# Patient Record
Sex: Female | Born: 1980
Health system: Southern US, Community
[De-identification: ages and names within clinical notes are randomized; demographics above are authoritative.]

## PROBLEM LIST (undated history)

## (undated) DIAGNOSIS — M797 Fibromyalgia: Secondary | ICD-10-CM

## (undated) DIAGNOSIS — Z8742 Personal history of other diseases of the female genital tract: Secondary | ICD-10-CM

## (undated) DIAGNOSIS — N289 Disorder of kidney and ureter, unspecified: Secondary | ICD-10-CM

## (undated) DIAGNOSIS — N201 Calculus of ureter: Secondary | ICD-10-CM

## (undated) DIAGNOSIS — E039 Hypothyroidism, unspecified: Secondary | ICD-10-CM

## (undated) DIAGNOSIS — M26609 Unspecified temporomandibular joint disorder, unspecified side: Secondary | ICD-10-CM

## (undated) DIAGNOSIS — D649 Anemia, unspecified: Secondary | ICD-10-CM

## (undated) HISTORY — PX: APPENDECTOMY: SHX54

## (undated) HISTORY — PX: LITHOTRIPSY: SUR834

## (undated) HISTORY — DX: Personal history of other diseases of the female genital tract: Z87.42

## (undated) HISTORY — PX: CYSTECTOMY: SUR359

## (undated) HISTORY — DX: Fibromyalgia: M79.7

---

## 1999-05-07 ENCOUNTER — Encounter: Payer: Self-pay | Admitting: Unknown Physician Specialty

## 1999-05-07 ENCOUNTER — Ambulatory Visit (HOSPITAL_COMMUNITY): Admission: RE | Admit: 1999-05-07 | Discharge: 1999-05-07 | Payer: Self-pay | Admitting: Unknown Physician Specialty

## 1999-05-12 ENCOUNTER — Other Ambulatory Visit: Admission: RE | Admit: 1999-05-12 | Discharge: 1999-05-12 | Payer: Self-pay | Admitting: Gynecology

## 1999-05-15 ENCOUNTER — Encounter: Payer: Self-pay | Admitting: Gynecology

## 1999-05-15 ENCOUNTER — Ambulatory Visit (HOSPITAL_COMMUNITY): Admission: RE | Admit: 1999-05-15 | Discharge: 1999-05-15 | Payer: Self-pay | Admitting: Gynecology

## 2000-01-21 ENCOUNTER — Inpatient Hospital Stay (HOSPITAL_COMMUNITY): Admission: AD | Admit: 2000-01-21 | Discharge: 2000-01-24 | Payer: Self-pay | Admitting: Gynecology

## 2000-02-10 DIAGNOSIS — Z8742 Personal history of other diseases of the female genital tract: Secondary | ICD-10-CM

## 2000-02-10 HISTORY — DX: Personal history of other diseases of the female genital tract: Z87.42

## 2000-03-07 ENCOUNTER — Other Ambulatory Visit: Admission: RE | Admit: 2000-03-07 | Discharge: 2000-03-07 | Payer: Self-pay | Admitting: Gynecology

## 2000-04-01 ENCOUNTER — Inpatient Hospital Stay (HOSPITAL_COMMUNITY): Admission: RE | Admit: 2000-04-01 | Discharge: 2000-04-03 | Payer: Self-pay | Admitting: Gynecology

## 2000-04-01 ENCOUNTER — Encounter (INDEPENDENT_AMBULATORY_CARE_PROVIDER_SITE_OTHER): Payer: Self-pay | Admitting: Specialist

## 2001-03-16 ENCOUNTER — Other Ambulatory Visit: Admission: RE | Admit: 2001-03-16 | Discharge: 2001-03-16 | Payer: Self-pay | Admitting: Gynecology

## 2002-04-18 ENCOUNTER — Other Ambulatory Visit: Admission: RE | Admit: 2002-04-18 | Discharge: 2002-04-18 | Payer: Self-pay | Admitting: Gynecology

## 2002-11-12 ENCOUNTER — Inpatient Hospital Stay (HOSPITAL_COMMUNITY): Admission: AD | Admit: 2002-11-12 | Discharge: 2002-11-14 | Payer: Self-pay | Admitting: Gynecology

## 2002-12-27 ENCOUNTER — Other Ambulatory Visit: Admission: RE | Admit: 2002-12-27 | Discharge: 2002-12-27 | Payer: Self-pay | Admitting: Gynecology

## 2004-01-16 ENCOUNTER — Other Ambulatory Visit: Admission: RE | Admit: 2004-01-16 | Discharge: 2004-01-16 | Payer: Self-pay | Admitting: Gynecology

## 2004-05-17 ENCOUNTER — Emergency Department (HOSPITAL_COMMUNITY): Admission: EM | Admit: 2004-05-17 | Discharge: 2004-05-18 | Payer: Self-pay | Admitting: Emergency Medicine

## 2005-02-04 ENCOUNTER — Other Ambulatory Visit: Admission: RE | Admit: 2005-02-04 | Discharge: 2005-02-04 | Payer: Self-pay | Admitting: Gynecology

## 2006-02-07 ENCOUNTER — Other Ambulatory Visit: Admission: RE | Admit: 2006-02-07 | Discharge: 2006-02-07 | Payer: Self-pay | Admitting: Gynecology

## 2006-03-25 ENCOUNTER — Ambulatory Visit (HOSPITAL_COMMUNITY): Admission: RE | Admit: 2006-03-25 | Discharge: 2006-03-25 | Payer: Self-pay | Admitting: Emergency Medicine

## 2007-02-17 ENCOUNTER — Other Ambulatory Visit: Admission: RE | Admit: 2007-02-17 | Discharge: 2007-02-17 | Payer: Self-pay | Admitting: Gynecology

## 2008-07-12 HISTORY — PX: TUBAL LIGATION: SHX77

## 2008-08-01 ENCOUNTER — Inpatient Hospital Stay (HOSPITAL_COMMUNITY): Admission: AD | Admit: 2008-08-01 | Discharge: 2008-08-03 | Payer: Self-pay | Admitting: Obstetrics and Gynecology

## 2008-08-11 ENCOUNTER — Emergency Department (HOSPITAL_COMMUNITY): Admission: EM | Admit: 2008-08-11 | Discharge: 2008-08-11 | Payer: Self-pay | Admitting: Family Medicine

## 2008-11-05 ENCOUNTER — Ambulatory Visit (HOSPITAL_COMMUNITY): Admission: RE | Admit: 2008-11-05 | Discharge: 2008-11-05 | Payer: Self-pay | Admitting: Obstetrics and Gynecology

## 2008-11-20 ENCOUNTER — Emergency Department (HOSPITAL_COMMUNITY): Admission: EM | Admit: 2008-11-20 | Discharge: 2008-11-20 | Payer: Self-pay | Admitting: Family Medicine

## 2010-05-27 ENCOUNTER — Emergency Department (HOSPITAL_COMMUNITY): Admission: EM | Admit: 2010-05-27 | Discharge: 2010-05-27 | Payer: Self-pay | Admitting: Family Medicine

## 2010-05-28 ENCOUNTER — Emergency Department (HOSPITAL_COMMUNITY): Admission: EM | Admit: 2010-05-28 | Discharge: 2010-05-28 | Payer: Self-pay | Admitting: Emergency Medicine

## 2010-05-29 ENCOUNTER — Emergency Department (HOSPITAL_COMMUNITY): Admission: EM | Admit: 2010-05-29 | Discharge: 2010-05-29 | Payer: Self-pay | Admitting: Emergency Medicine

## 2010-07-30 ENCOUNTER — Other Ambulatory Visit: Payer: Self-pay | Admitting: Women's Health

## 2010-07-30 ENCOUNTER — Ambulatory Visit
Admission: RE | Admit: 2010-07-30 | Discharge: 2010-07-30 | Payer: Self-pay | Source: Home / Self Care | Attending: Women's Health | Admitting: Women's Health

## 2010-07-30 ENCOUNTER — Other Ambulatory Visit
Admission: RE | Admit: 2010-07-30 | Discharge: 2010-07-30 | Payer: Self-pay | Source: Home / Self Care | Admitting: Gynecology

## 2010-08-03 ENCOUNTER — Encounter
Admission: RE | Admit: 2010-08-03 | Discharge: 2010-08-03 | Payer: Self-pay | Source: Home / Self Care | Attending: Obstetrics and Gynecology | Admitting: Obstetrics and Gynecology

## 2010-10-20 LAB — POCT URINALYSIS DIP (DEVICE)
Glucose, UA: NEGATIVE mg/dL
Nitrite: NEGATIVE
Urobilinogen, UA: 0.2 mg/dL (ref 0.0–1.0)

## 2010-10-20 LAB — POCT PREGNANCY, URINE: Preg Test, Ur: NEGATIVE

## 2010-10-21 LAB — URINALYSIS, ROUTINE W REFLEX MICROSCOPIC
Glucose, UA: NEGATIVE mg/dL
Nitrite: NEGATIVE
Protein, ur: NEGATIVE mg/dL
Urobilinogen, UA: 0.2 mg/dL (ref 0.0–1.0)

## 2010-10-21 LAB — CBC
MCHC: 34.6 g/dL (ref 30.0–36.0)
RBC: 4.53 MIL/uL (ref 3.87–5.11)

## 2010-10-21 LAB — PREGNANCY, URINE: Preg Test, Ur: NEGATIVE

## 2010-10-26 LAB — CBC
Hemoglobin: 10.6 g/dL — ABNORMAL LOW (ref 12.0–15.0)
Platelets: 216 10*3/uL (ref 150–400)
Platelets: 250 10*3/uL (ref 150–400)
RDW: 17.7 % — ABNORMAL HIGH (ref 11.5–15.5)
WBC: 15.1 10*3/uL — ABNORMAL HIGH (ref 4.0–10.5)

## 2010-10-26 LAB — RPR: RPR Ser Ql: NONREACTIVE

## 2010-10-26 LAB — CCBB MATERNAL DONOR DRAW

## 2010-11-24 NOTE — Op Note (Signed)
Jenna Gray, Jenna Gray              ACCOUNT NO.:  [id number]   MEDICAL RECORD NO.:  [id number]          PATIENT TYPE:  AMB   LOCATION:  SDC                           FACILITY:  [facility]   PHYSICIAN:  Gerald Leitz, MD          DATE OF BIRTH:  August 05, 1980   DATE OF PROCEDURE:  11/05/2008  DATE OF DISCHARGE:                               OPERATIVE REPORT   PREOPERATIVE DIAGNOSIS:  Desires permanent sterilization.   POSTOPERATIVE DIAGNOSIS:  Desires permanent sterilization.   PROCEDURE:  Laparoscopic bilateral tubal ligation with Filshie clip.   SURGEON:  Gerald Leitz, MD   ASSISTANT:  None.   ANESTHESIA:  General.   FINDINGS:  Small adhesions of small intestines to anterior abdominal  wall.   SPECIMEN:  None.   ESTIMATED BLOOD LOSS:  Minimal.   URINE OUTPUT:  25 mL at the beginning of the procedure.   FLUIDS:  Per Anesthesia.   COMPLICATIONS:  None.   INDICATIONS:  A 30 year old G4, P4 who desires permanent sterilization.  Informed consent was obtained.   PROCEDURE:  The patient was taken to the operating room where she was  placed under general anesthesia.  She was placed in dorsal lithotomy  position, prepped and draped in the usual sterile fashion.  An in-and-  out catheterization was performed prior to draping the patient and a  speculum was placed into the vaginal vault and the uterine manipulator  was placed without difficulty.  The patient was then draped.  0.25%  Marcaine was injected at the umbilicus and a 10-mm skin incision was  made at the umbilicus with the scalpel.  The 10-mm Excel trocar was  placed under direct visualization.  Pneumoperitoneum was then achieved  with CO2 gas.  The patient was then noted to have some adhesions along  the left aspect of the abdomen of the intestines to the anterior  abdominal wall.  The laparoscope was easily maneuvered beyond this point  without difficulty.  Fallopian tubes were identified bilaterally and  followed up to the  ipsilateral fimbria.  The ovaries appeared normal  bilaterally.  A 10-mm operative laparoscope was inserted through the 10-  mm trocar and Filshie clips were placed along the right and left  fallopian tubes without difficulty.  Operative scope was removed.  Diagnostic scope was then replaced and the pneumoperitoneum was  released.  The 10-mm trocar was removed under direct visualization.  The  fascia was closed with 0 Vicryl.  Skin was closed with 4-0 Vicryl.  Attention was turned to the vagina where a speculum placed and the  uterine manipulator  was removed.  The patient was found to have some bleeding at the  tenaculum site.  Silver nitrate was applied.  Hemostasis was obtained.  The patient was awakened from anesthesia, taken to the recovery room  awake and in stable condition.      Gerald Leitz, MD  Electronically Signed     TC/MEDQ  D:  11/05/2008  T:  11/06/2008  Job:  [id number]

## 2010-11-27 NOTE — Discharge Summary (Signed)
[facility]  Patient:    Jenna Gray, Jenna Gray                     MRN: [id number] Adm. Date:  [id number] Disc. Date: [id number] Attending:  Douglass Rivers Dictator:   Antony Contras, [facility]                           Discharge Summary  DISCHARGE DIAGNOSES:          1. Intrauterine pregnancy at 39 weeks.                               2. History of left ovarian mass per ultrasound.  PROCEDURES:                   Normal spontaneous vaginal delivery viable infant with second degree midline laceration repair.  HISTORY OF PRESENT ILLNESS:   The patient is a 30 year old gravida 2, para 1-0-0-1, LMP April 21, 1999, [facility], January 19, 2000.  Prenatal risk factors include a pelvic mass which was noted on an ultrasound on May 07, 1999, at [place]. [facility] which demonstrated a left ovarian mass measuring 4.9 x 4.5 x 4.4 cm with some solid nodules.  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  PRENATAL LABS:                Blood type O positive, antibody screen negative. RPR, HBSAG, and HIV nonreactive.  MSAST within normal limits. GBS is negative.   HOSPITAL COURSE/TREATMENT:    The patient was admitted on January 22, 2000, with two to five minute contractions.  Cervix was 3 cm, 80% effaced.  Labor did progress to complete dilatation.  She was delivered of an Apgar 8 and 21 female infant weighing 8 pounds 12 ounces over an intact perineum, sustaining a second degree midline laceration.  Her postpartum course was within normal limits.  She remained afebrile and no difficulty with voiding.  She was able to be discharged on her second postpartum day in satisfactory condition.  DISPOSITION:                  Follow-up in six weeks.  Continue prenatal vitamins and iron.  The patient was also counseled that she will need to arrange follow-up on the ovarian mass which will be  done at her postpartum appointment.  Postpartum CBC revealed hematocrit 29.5, hemoglobin 9.8, wbc 12.8, platelets 247. DD:  02/22/00 TD:  02/23/00 Job: [id number]

## 2010-11-27 NOTE — Discharge Summary (Signed)
[facility]  Patient:    Jenna Gray, Jenna Gray                     MRN: [id number] Adm. Date:  [id number] Disc. Date: [id number] Attending:  Tonye Royalty Dictator:   Antony Contras, [facility]                           Discharge Summary  DISCHARGE DIAGNOSIS:          Dermoid cyst.  PROCEDURE:                    Left ovarian cystectomy.  HISTORY OF PRESENT ILLNESS:   The patient is a 30 year old, gravida 1, para 1, who had a normal spontaneous vaginal delivery on January 22, 2000, and had returned for an ultrasound because of the possibility that she had a persistent dermoid cyst that had been diagnosed right before conception, and her surgery had to be canceled because she was pregnant.  She had initially been seen before her pregnancy in the Urgent Care Clinic for a back injury, and incidentally the ovarian cyst was noted at the time of a CT scan of her back.  During her pregnancy the cyst was monitored with essentially no change and appeared to be highly suspicious for a dermoid.  She otherwise has no risk factors.  She was admitted for an exploratory laparotomy with a left ovarian cystectomy and possible left salpingo-oophorectomy and possible staging procedures.  HOSPITAL COURSE:              The patient was admitted for surgery.  A left ovarian cystectomy was performed by Dr. Lily Peer assisted by Dr. Penni Homans under general anesthesia.  Findings:  There was a 3 x 4 cm left ovarian cyst. Postoperatively, the patient remained afebrile, had no difficulty voiding, and was able to be discharged on her second postoperative day in satisfactory condition.  LABORATORY DATA:              CBC:  Hematocrit 33.4, hemoglobin 11.3, platelets 222.  DISCHARGE FOLLOWUP:           To follow up in the office in one week to remove staples.  DISCHARGE MEDICATIONS:        Percocet for pain. DD:  05/30/00 TD:  05/30/00 Job: [id number]

## 2010-11-27 NOTE — Discharge Summary (Signed)
   NAME:  Jenna Gray, Jenna Gray Rhonda L                        ACCOUNT NO.:  [id number]   MEDICAL RECORD NO.:  [id number]                   PATIENT TYPE:  INP   LOCATION:  9126                                 [facility]ACILITY:  [facility]   PHYSICIAN:  Timothy P. Fontaine, M.D.           DATE OF BIRTH:  19-May-1981   DATE OF ADMISSION:  11/12/2002  DATE OF DISCHARGE:  11/14/2002                                 DISCHARGE SUMMARY   DISCHARGE DIAGNOSIS:  Intrauterine pregnancy, 39 weeks, delivered, status  post spontaneous vaginal delivery.   HISTORY:  This is a 30 year old female, gravida 3, para 2, with an EDC of  11/15/2002.  Prenatal course was uncomplicated.   HOSPITAL COURSE:  On 11/12/2002, the patient was admitted at 39+ weeks  complaining of bloody discharge.  Vaginal exam was 4 cm, where it had been 1  to 2 the previous p.m.  Therefore secondary to advanced dilatation, the  patient was admitted, artificial rupture of membranes was performed, and  subsequently on 11/12/2002, the patient underwent a spontaneous vaginal  delivery of a female, Apgars of 9 and 9, weight 8 pounds and 11 ounces.  There  was noted to be a tight nuchal cord which was cut and clamped prior to  delivery.  There was a first degree which was repaired without  complications.  Postpartum, the patient remained afebrile, voiding, in  stable condition.  She was discharged home on 11/14/2002 and given  [facility] postpartum booklet.   CLINICAL FINDINGS AND LABS:  The patient is O positive.  Rubella immune.  Hemoglobin on 11/13/2002 was 11.1.   DISPOSITION:  The patient is discharged home.  Appointment to return to  office in six weeks.  __________ time to be see in the office.  Was given a  prescription for Tylox p.r.n. pain.      Susa Loffler, P.A.                    Timothy P. Audie Box, M.D.    Ardath Sax  D:  12/11/2002  T:  12/11/2002  Job:  [id number]

## 2010-11-27 NOTE — Op Note (Signed)
[facility]  Patient:    Jenna Gray, Jenna Gray Visit Number: [id number] MRN: [id number]          Service Type: GYN Location: [id number] Attending Physician:  Tonye Royalty Proc. Date: 04/01/00 Admit Date:  04/01/2000                             Operative Report  PREOPERATIVE DIAGNOSIS:       Left dermoid cyst.  POSTOPERATIVE DIAGNOSIS:      Left dermoid cyst.  OPERATION:                    1. Exploratory laparotomy.                               2. Left ovarian cystectomy.  SURGEON:                      Juan H. Lily Peer, M.D.  ASSISTANT:                    Katy Fitch, M.D.  ANESTHESIA:                   General endotracheal.  INDICATIONS FOR PROCEDURE:    A 30 year old gravida 1, para 1 with persistent left adnexal mass.  Characteristic features on ultrasound suspicious for a dermoid cyst.  FINDINGS:                     The patient had a 3 x 4 cm left ovarian cyst consistent with the appearance of a dermoid cyst.  There was no external excrescences on the left ovary.  Both fallopian tubes looked nice and healthy with lush fimbriated end.  The contralateral ovary was normal size, shape, and consistency.  Cul-de-sac was free of adhesions and the peritoneal surfaces without evidence of endometriosis.  DESCRIPTION OF PROCEDURE:     After the patient was adequately counseled, she was taken to the operating room where she underwent a successful general endotracheal anesthesia.  She was placed in the supine position after the abdomen, vagina, and perineum were prepped and draped in the usual sterile fashion.  Prior to this, a Foley catheter had been inserted.  A small Pfannenstiel incision was made 2 cm above the symphysis pubis.  The incision was carried down through the skin and subcutaneous tissue, down to the rectus fascia, and the rectus fascia was incised in a transverse fashion.  The midline raphe was entered and the  peritoneal cavity was entered cautious. Pelvic washings were obtained.  The left adnexal mass is mobile and was brought out through the incision site for minimal contact with the peritoneal surfaces to prevent adhesion formation later.  A wet lap was placed around the ovary and the ovary was placed under tension with a Babcock clamp at the utero-ovarian ligament junction.                                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 The cortex of the ovary was irrigated and remaining ovarian tissue was reapproximated in a mattress stitch-type fashion  with several layers of 3-0 Vicryl suture, imbricating and hiding the suture.                                After this, the pelvic cavity was copiously irrigated with normal saline solution.  The visceroperitoneum was now reapproximated.  Sponge count and needle count were correct and the rectus fascia was closed with a running stitch of 0 Vicryl suture.  Subcutaneous bleeders were Bovie cauterized and the skin was reapproximated with skin clips followed by placement of Xeroform gauze followed by a dressing.                                The patient was extubated and transferred to the recovery room with stable vital signs.  Blood loss for the procedure was minimal.  Fluid resuscitation consisted of 1200 cc of lactated Ringers and urine output was 200 cc clear, and the patient received 1 g of Cefotan prophylactically. Attending Physician:  Tonye Royalty DD:  04/01/00 TD:  04/03/00 Job: [id number]

## 2010-12-02 ENCOUNTER — Ambulatory Visit: Payer: Self-pay | Admitting: Women's Health

## 2011-01-30 ENCOUNTER — Encounter: Payer: Self-pay | Admitting: *Deleted

## 2011-01-30 ENCOUNTER — Emergency Department (HOSPITAL_BASED_OUTPATIENT_CLINIC_OR_DEPARTMENT_OTHER)
Admission: EM | Admit: 2011-01-30 | Discharge: 2011-01-30 | Disposition: A | Discharge status: Home / Self Care | Payer: 59 | Attending: Emergency Medicine | Admitting: Emergency Medicine

## 2011-01-30 DIAGNOSIS — S239XXA Sprain of unspecified parts of thorax, initial encounter: Secondary | ICD-10-CM | POA: Insufficient documentation

## 2011-01-30 DIAGNOSIS — X500XXA Overexertion from strenuous movement or load, initial encounter: Secondary | ICD-10-CM | POA: Insufficient documentation

## 2011-01-30 MED ORDER — KETOROLAC TROMETHAMINE 60 MG/2ML IM SOLN
60.0000 mg | Freq: Once | INTRAMUSCULAR | Status: AC
  Administered 2011-01-30: 60 mg via INTRAMUSCULAR
  Filled 2011-01-30: qty 2

## 2011-01-30 MED ORDER — CYCLOBENZAPRINE HCL 10 MG PO TABS: 10.0000 mg | ORAL_TABLET | Freq: Two times a day (BID) | ORAL | Status: AC | PRN

## 2011-01-30 MED ORDER — IBUPROFEN 800 MG PO TABS: 800.0000 mg | ORAL_TABLET | Freq: Three times a day (TID) | ORAL | Status: AC

## 2011-01-30 NOTE — ED Provider Notes (Signed)
History     Chief Complaint  Patient presents with  . Back Pain   HPI Comments: Patient was bending over and twisting while carrying a heavy glass dish and felt an acute onset of right sided mid and upper back pain. This is constant, worse with rotation and palpation, not associated with weakness or numbness of the upper extremities. This occurred several hours prior to arrival. She has tried both hot and cold packs without relief but no medications prior to arrival.  Patient is a 30 y.o. female presenting with back pain. The history is provided by the patient and a relative.  Back Pain  Pertinent negatives include no fever, no numbness and no weakness.    History reviewed. No pertinent past medical history.  Past Surgical History  Procedure Date  . Cystectomy   . Appendectomy   . Tubal ligation     History reviewed. No pertinent family history.  History  Substance Use Topics  . Smoking status: Never Smoker   . Smokeless tobacco: Not on file  . Alcohol Use: No    OB History    Grav Para Term Preterm Abortions TAB SAB Ect Mult Living                  Review of Systems  Constitutional: Negative for fever and chills.  HENT: Negative for neck pain.   Gastrointestinal: Negative for nausea, vomiting and diarrhea.  Genitourinary: Negative for difficulty urinating.  Musculoskeletal: Positive for back pain.  Skin: Negative for rash.  Neurological: Negative for weakness and numbness.    Physical Exam  BP 129/78  Pulse 84  Temp(Src) 98.9 F (37.2 C) (Oral)  Resp 18  Ht 5\' 1"  (1.549 m)  Wt 150 lb (68.04 kg)  BMI 28.34 kg/m2  SpO2 100%  LMP 01/23/2011  Physical Exam  Nursing note and vitals reviewed. Constitutional: She appears well-developed and well-nourished. No distress.  HENT:  Head: Normocephalic and atraumatic.  Eyes: Conjunctivae are normal. No scleral icterus.  Neck: Normal range of motion. Neck supple.  Cardiovascular: Normal rate, regular rhythm and  normal heart sounds.   Pulmonary/Chest: Effort normal and breath sounds normal.  Musculoskeletal: Normal range of motion. She exhibits tenderness ( Tenderness to palpation of the right rhomboid area and right paraspinal muscles in the thoracic spine. There is no cervical, thoracic, lumbar, sacral tenderness to palpation.). She exhibits no edema.  Lymphadenopathy:    She has no cervical adenopathy.  Neurological: She is alert. Coordination normal.       Motor and sensation of the upper and lower extremities normal.  Skin: Skin is warm. No rash noted.    ED Course  Procedures  MDM Patient with likely strain of the muscles of her back. IM Toradol given in the emergency department. Vital signs normal, neurologic exam normal. Will discharge with ibuprofen and Flexeril.      Vida Roller, MD 01/30/11 2059

## 2011-01-30 NOTE — ED Notes (Signed)
Pt states she was lifting a dish and felt a sharp pain in her lower back.

## 2011-04-26 ENCOUNTER — Encounter: Payer: Self-pay | Admitting: Women's Health

## 2011-04-26 ENCOUNTER — Ambulatory Visit (INDEPENDENT_AMBULATORY_CARE_PROVIDER_SITE_OTHER): Payer: 59 | Admitting: Women's Health

## 2011-04-26 ENCOUNTER — Ambulatory Visit (INDEPENDENT_AMBULATORY_CARE_PROVIDER_SITE_OTHER): Payer: 59

## 2011-04-26 VITALS — BP 120/70

## 2011-04-26 DIAGNOSIS — R1032 Left lower quadrant pain: Secondary | ICD-10-CM

## 2011-04-26 DIAGNOSIS — N831 Corpus luteum cyst of ovary, unspecified side: Secondary | ICD-10-CM

## 2011-04-26 NOTE — Progress Notes (Signed)
  Presents with a complaint of lower left quadrant pain for greater than one week. She states she is unable to sleep well due to the discomfort. She does appear well today. Denies any nausea, vomiting, constipation or diarrhea or changes in elimination. Denies any UTI symptoms, denies discharge. Was seen at urgent  care yesterday and was prescribed Mobic and Flagyl for BV. Continues with the pain. History of a left dermoid cyst in 2001, BTL in 10, and appendectomy at age 71. Having a monthly regular cycle.  Exam: No CVAT, abdomen is soft no rebound or radiation but tender in the lower left quadrant. Speculum exam no discharge or odor noted. Bimanual no CMT no tenderness on the right side but was tender on the left, no adnexal fullness.  Plan: Ultrasound, shows an anteverted uterus, right ovary has a thick walled follicle 14 x 13 mm, left ovary was normal negative cul-de-sac she is due for a cycle next week. Will continue with the Mobic and Motrin as needed for pain, increase rest call or return if no relief of pain.

## 2011-07-08 ENCOUNTER — Ambulatory Visit (INDEPENDENT_AMBULATORY_CARE_PROVIDER_SITE_OTHER): Payer: 59

## 2011-07-08 ENCOUNTER — Emergency Department (INDEPENDENT_AMBULATORY_CARE_PROVIDER_SITE_OTHER)
Admission: EM | Admit: 2011-07-08 | Discharge: 2011-07-08 | Disposition: A | Discharge status: Home / Self Care | Payer: 59 | Source: Home / Self Care

## 2011-07-08 ENCOUNTER — Encounter (HOSPITAL_COMMUNITY): Payer: Self-pay | Admitting: Emergency Medicine

## 2011-07-08 DIAGNOSIS — K0889 Other specified disorders of teeth and supporting structures: Secondary | ICD-10-CM

## 2011-07-08 DIAGNOSIS — N920 Excessive and frequent menstruation with regular cycle: Secondary | ICD-10-CM

## 2011-07-08 DIAGNOSIS — R509 Fever, unspecified: Secondary | ICD-10-CM

## 2011-07-08 DIAGNOSIS — K089 Disorder of teeth and supporting structures, unspecified: Secondary | ICD-10-CM

## 2011-07-08 MED ORDER — CLINDAMYCIN HCL 300 MG PO CAPS: 300.0000 mg | ORAL_CAPSULE | Freq: Three times a day (TID) | ORAL | Status: AC

## 2011-07-08 MED ORDER — KETOROLAC TROMETHAMINE 60 MG/2ML IM SOLN
INTRAMUSCULAR | Status: AC
  Filled 2011-07-08: qty 2

## 2011-07-08 MED ORDER — KETOROLAC TROMETHAMINE 60 MG/2ML IM SOLN
60.0000 mg | Freq: Once | INTRAMUSCULAR | Status: AC
  Administered 2011-07-08: 60 mg via INTRAMUSCULAR

## 2011-07-08 NOTE — ED Provider Notes (Signed)
History     CSN: [id number]  Arrival date & time 07/08/11  1202   None     Chief Complaint  Patient presents with  . Dental Pain    (Consider location/radiation/quality/duration/timing/severity/associated sxs/prior treatment) HPI Comments: Onset of Rt lower tooth pain 9 pm last night. Pt states she had a root canal when she was younger, on her mothers dental insurance and had a temporary crown. She didn't return for the permanent crown. She no longer has dental insurance Her dentist referred her 6-7 mos ago to an Transport planner but she was unable to afford the OV fee so didn't go. No fever, chills or swelling noted. She has been taking Tylenol and Ibuprofen without relief of her discomfort. Pain is worse with eating, drinking and cold air exposure.  The history is provided by the patient.    Past Medical History  Diagnosis Date  . History of ovarian cyst 02/2000    DERMOID, LEFT  . Fibromyalgia     Past Surgical History  Procedure Date  . Cystectomy     OVARIAN DERMOID  . Tubal ligation   . Appendectomy     AGE 30    Family History  Problem Relation Age of Onset  . Hypertension Father   . Breast cancer Maternal Aunt   . Cancer Paternal Grandmother     COLON , PANCREATIC    History  Substance Use Topics  . Smoking status: Never Smoker   . Smokeless tobacco: Never Used  . Alcohol Use: No    OB History    Grav Para Term Preterm Abortions TAB SAB Ect Mult Living   4 4        4       Review of Systems  Constitutional: Negative for fever and chills.  HENT: Negative for ear pain, congestion, sore throat, rhinorrhea and sinus pressure.   Respiratory: Negative for shortness of breath.   Cardiovascular: Negative for chest pain.    Allergies  Penicillins  Home Medications   Current Outpatient Rx  Name Route Sig Dispense Refill  . ACETAMINOPHEN 325 MG PO TABS Oral Take 650 mg by mouth every 6 (six) hours as needed.      Marland Kitchen BENZOCAINE 10 % MT GEL Mouth/Throat  Use as directed in the mouth or throat as needed.      . IBUPROFEN 200 MG PO TABS Oral Take 200 mg by mouth every 6 (six) hours as needed.      Marland Kitchen CLINDAMYCIN HCL 300 MG PO CAPS Oral Take 1 capsule (300 mg total) by mouth 3 (three) times daily. 21 capsule 0    BP 135/86  Pulse 74  Temp(Src) 98.6 F (37 C) (Oral)  Resp 16  SpO2 100%  LMP 07/01/2011  Physical Exam  Nursing note and vitals reviewed. Constitutional: She appears well-developed and well-nourished. No distress.  HENT:  Head: Normocephalic and atraumatic.  Right Ear: Tympanic membrane, external ear and ear canal normal.  Left Ear: Tympanic membrane, external ear and ear canal normal.  Nose: Nose normal.  Mouth/Throat: Uvula is midline, oropharynx is clear and moist and mucous membranes are normal. No oropharyngeal exudate, posterior oropharyngeal edema or posterior oropharyngeal erythema.    Neck: Neck supple.  Cardiovascular: Normal rate, regular rhythm and normal heart sounds.   Pulmonary/Chest: Effort normal and breath sounds normal. No respiratory distress.  Lymphadenopathy:    She has no cervical adenopathy.  Neurological: She is alert.  Skin: Skin is warm and dry.  Psychiatric: She  has a normal mood and affect.    ED Course  Procedures (including critical care time)  Labs Reviewed - No data to display No results found.   1. Pain, dental       MDM  Previous records reviewed. Hx of Tramadol abuse.         Melody Comas, [place] 07/08/11 1527

## 2011-07-08 NOTE — ED Notes (Signed)
C/o tooth pain, bottom, right.  Previously went to dentist and sent to oral surgeon, expense too much.  Patient reports tooth is broken.

## 2011-07-09 NOTE — ED Provider Notes (Signed)
Medical screening examination/treatment/procedure(s) were performed by non-physician practitioner and as supervising physician I was immediately available for consultation/collaboration.   [facility]; MD   Sharin Grave, MD 07/09/11 [phone]

## 2011-08-08 ENCOUNTER — Encounter (HOSPITAL_COMMUNITY): Payer: Self-pay

## 2011-08-08 ENCOUNTER — Emergency Department (HOSPITAL_COMMUNITY)
Admission: EM | Admit: 2011-08-08 | Discharge: 2011-08-08 | Disposition: A | Discharge status: Home / Self Care | Payer: 59 | Source: Home / Self Care | Attending: Emergency Medicine | Admitting: Emergency Medicine

## 2011-08-08 DIAGNOSIS — S0340XA Sprain of jaw, unspecified side, initial encounter: Secondary | ICD-10-CM

## 2011-08-08 HISTORY — DX: Unspecified temporomandibular joint disorder, unspecified side: M26.609

## 2011-08-08 MED ORDER — HYDROCODONE-ACETAMINOPHEN 5-325 MG PO TABS: 2.0000 | ORAL_TABLET | Freq: Once | ORAL | Status: DC

## 2011-08-08 MED ORDER — HYDROCODONE-ACETAMINOPHEN 5-325 MG PO TABS: 2.0000 | ORAL_TABLET | ORAL | Status: AC | PRN

## 2011-08-08 MED ORDER — MELOXICAM 15 MG PO TABS: 15.0000 mg | ORAL_TABLET | Freq: Every day | ORAL | Status: DC

## 2011-08-08 NOTE — ED Notes (Signed)
Signature pad not working. 

## 2011-08-08 NOTE — ED Provider Notes (Signed)
History     CSN: [id number]  Arrival date & time 08/08/11  1017   First MD Initiated Contact with Patient 08/08/11 1037      Chief Complaint  Patient presents with  . Headache    (Consider location/radiation/quality/duration/timing/severity/associated sxs/prior treatment) HPI Comments: Pt c/o constant throbbing bilateral temporal/frontal headache starting 2 days ago after yawning. States she heard a "pop" and a "hissing noise" in her TMJ, and has had continuous headache since. Pain worse with opening jaw. States she is unable to open her jaw fully secondary to pain. No N/V, ear pain, photophobia, phonophobia, nasal congestion, sinus pain/pressure, ear pain,  purulent nasal d/c, dental pain, neck stiffness, rash, dysarthria, focal weakness, facial droop. No sudden onset, did not occur during exertion.  Has been taking ibuprofen 800 mg 3 times a day, which helps, however patient states that it is starting to "tear up my stomach". Also taking Imitrex, Excedrin without relief.  Patient with history of migraines, although states this headache is different. Patient with history of TMJ dysfunction, and states that this pain is similar to that.  ROS as noted in HPI. All other ROS negative.     Patient is a 31 y.o. female presenting with headaches. The history is provided by the patient. No language interpreter was used.  Headache The primary symptoms include headaches. The symptoms began 3 to 5 days ago. The symptoms are unchanged.    Past Medical History  Diagnosis Date  . History of ovarian cyst 02/2000    DERMOID, LEFT  . Fibromyalgia   . Migraine   . TMJ (temporomandibular joint syndrome)     Past Surgical History  Procedure Date  . Cystectomy     OVARIAN DERMOID  . Tubal ligation   . Appendectomy     AGE 14    Family History  Problem Relation Age of Onset  . Hypertension Father   . Breast cancer Maternal Aunt   . Cancer Paternal Grandmother     COLON , PANCREATIC     History  Substance Use Topics  . Smoking status: Never Smoker   . Smokeless tobacco: Never Used  . Alcohol Use: No    OB History    Grav Para Term Preterm Abortions TAB SAB Ect Mult Living   4 4        4       Review of Systems  Neurological: Positive for headaches.    Allergies  Penicillins  Home Medications   Current Outpatient Rx  Name Route Sig Dispense Refill  . METOCLOPRAMIDE HCL 10 MG PO TABS Oral Take 10 mg by mouth 4 (four) times daily.    Marland Kitchen OVER THE COUNTER MEDICATION      . SUMATRIPTAN SUCCINATE 100 MG PO TABS Oral Take 100 mg by mouth every 2 (two) hours as needed.    Marland Kitchen HYDROCODONE-ACETAMINOPHEN 5-325 MG PO TABS Oral Take 2 tablets by mouth every 4 (four) hours as needed for pain. 20 tablet 0  . MELOXICAM 15 MG PO TABS Oral Take 1 tablet (15 mg total) by mouth daily. 14 tablet 0    BP 152/95  Pulse 122  Temp(Src) 98.2 F (36.8 C) (Oral)  Resp 20  SpO2 100%  LMP 07/26/2011  Physical Exam  Nursing note and vitals reviewed. Constitutional: She is oriented to person, place, and time. She appears well-developed and well-nourished. She appears distressed.       Appears uncomfortable  HENT:  Head: Normocephalic and atraumatic.  Right Ear: Tympanic  membrane normal.  Left Ear: Tympanic membrane normal.  Nose: Nose normal. Right sinus exhibits no maxillary sinus tenderness and no frontal sinus tenderness. Left sinus exhibits no maxillary sinus tenderness and no frontal sinus tenderness.  Mouth/Throat: Uvula is midline, oropharynx is clear and moist and mucous membranes are normal. Normal dentition.       Bilateral TMJ tenderness. Palpable "click" when patient opening, closing jaw. Bilateral claudication.   Eyes: Conjunctivae and EOM are normal. Pupils are equal, round, and reactive to light.  Neck: Normal range of motion and full passive range of motion without pain. Neck supple. No Brudzinski's sign and no Kernig's sign noted.  Cardiovascular: Regular  rhythm and normal heart sounds.  Tachycardia present.   Pulmonary/Chest: Effort normal and breath sounds normal.  Abdominal: Soft. Bowel sounds are normal. She exhibits no distension.  Musculoskeletal: Normal range of motion.  Lymphadenopathy:    She has no cervical adenopathy.  Neurological: She is alert and oriented to person, place, and time. She has normal strength. No cranial nerve deficit or sensory deficit. Coordination and gait normal.       Tandem gait steady.   Skin: Skin is warm and dry.  Psychiatric: She has a normal mood and affect. Her behavior is normal. Judgment and thought content normal.    ED Course  Procedures (including critical care time)  Labs Reviewed - No data to display No results found.   1. TMJ (sprain of temporomandibular joint)       MDM  Previous records reviewed. Patient last seen urgent care for right lower tooth pain in late December 2012. Has history of tramadol abuse per chart review. However, patient has acute injury. Will send home with long-acting NSAIDs, and a short narcotic prescription. Patient has appointment with oral surgery on February 8, however, instructed patient to try and schedule appointment for earlier.  Luiz Blare, MD 08/08/11 [phone]

## 2011-08-08 NOTE — ED Notes (Signed)
Pt has hx of migraines and has had headache for 4 days, she yawned two days ago and felt "pop" in jaw and can't open mouth fully.  Pt has hx of TMJ.

## 2011-08-16 ENCOUNTER — Telehealth: Payer: Self-pay

## 2011-08-16 NOTE — Telephone Encounter (Signed)
.  [facility] FROM [facility] HAVE QUESTIONS REGARDING PT'S TRAMADOL

## 2011-08-17 NOTE — Telephone Encounter (Signed)
Got a call from [facility] pharmacy regarding pt's tramadol.  Pt got 05/18/11 #315 to last 90 days.  She also got #90 [facility] left over from old Rx with rfs.  The rest of the RF have been cancelled.  Pt should be ready for RF on 3/4.  Per pharmacist at [facility] Janeice Robinson) she has used [facility] in the past and tried to get filled early multiple times.  Pt must have OV before any RFs are given.

## 2011-08-20 ENCOUNTER — Ambulatory Visit (INDEPENDENT_AMBULATORY_CARE_PROVIDER_SITE_OTHER): Payer: 59 | Admitting: Family Medicine

## 2011-08-20 ENCOUNTER — Encounter: Payer: Self-pay | Admitting: Family Medicine

## 2011-08-20 DIAGNOSIS — G4700 Insomnia, unspecified: Secondary | ICD-10-CM

## 2011-08-20 DIAGNOSIS — Z0000 Encounter for general adult medical examination without abnormal findings: Secondary | ICD-10-CM

## 2011-08-20 LAB — CBC WITH DIFFERENTIAL/PLATELET
Eosinophils Relative: 3.1 % (ref 0.0–5.0)
HCT: 31.8 % — ABNORMAL LOW (ref 36.0–46.0)
Hemoglobin: 10.5 g/dL — ABNORMAL LOW (ref 12.0–15.0)
Lymphocytes Relative: 30.9 % (ref 12.0–46.0)
Lymphs Abs: 2.8 10*3/uL (ref 0.7–4.0)
Monocytes Relative: 7.3 % (ref 3.0–12.0)
Neutro Abs: 5.3 10*3/uL (ref 1.4–7.7)
RBC: 4.16 Mil/uL (ref 3.87–5.11)
WBC: 9.2 10*3/uL (ref 4.5–10.5)

## 2011-08-20 LAB — HEPATIC FUNCTION PANEL
ALT: 21 U/L (ref 0–35)
Albumin: 3.8 g/dL (ref 3.5–5.2)
Alkaline Phosphatase: 51 U/L (ref 39–117)
Total Protein: 7.4 g/dL (ref 6.0–8.3)

## 2011-08-20 LAB — LDL CHOLESTEROL, DIRECT: Direct LDL: 138.4 mg/dL

## 2011-08-20 LAB — BASIC METABOLIC PANEL
CO2: 25 mEq/L (ref 19–32)
Calcium: 8.9 mg/dL (ref 8.4–10.5)
Chloride: 103 mEq/L (ref 96–112)
Creatinine, Ser: 0.9 mg/dL (ref 0.4–1.2)
Glucose, Bld: 96 mg/dL (ref 70–99)

## 2011-08-20 LAB — LIPID PANEL
Cholesterol: 215 mg/dL — ABNORMAL HIGH (ref 0–200)
VLDL: 44.2 mg/dL — ABNORMAL HIGH (ref 0.0–40.0)

## 2011-08-20 MED ORDER — TRAMADOL HCL 50 MG PO TABS: 50.0000 mg | ORAL_TABLET | Freq: Four times a day (QID) | ORAL | Status: DC | PRN

## 2011-08-20 MED ORDER — CLONAZEPAM 0.5 MG PO TABS: 0.5000 mg | ORAL_TABLET | Freq: Every evening | ORAL | Status: DC | PRN

## 2011-08-20 NOTE — Progress Notes (Signed)
  Subjective:    Patient ID: Jenna Gray, female    DOB: 1981-03-14, 31 y.o.   MRN: [id number]  HPI New to establish.  Previous MD- [facility].  GYN- [facility]  Insomnia- started 3rd shift 9 months ago.  Reports increased stress, financial issues.  Having difficulty both falling asleep and staying asleep.  Reports she will wake up at 'any little noise'.  Once awake, will have trouble 'turning my brain off'.  Denies depressive sxs.  Has tried Tylenol PM previously to fall asleep.  Also took amitriptyline but this caused HAs.  Previously took Alprazolam prn for anxiety.  Health maintenance- UTD on pap, mammo.  Overdue for labs.   Review of Systems For ROS see HPI     Objective:   Physical Exam  Vitals reviewed. Constitutional: She is oriented to person, place, and time. She appears well-developed and well-nourished. No distress.  HENT:  Head: Normocephalic and atraumatic.  Eyes: Conjunctivae and EOM are normal. Pupils are equal, round, and reactive to light.  Neck: Normal range of motion. Neck supple. No thyromegaly present.  Cardiovascular: Normal rate, regular rhythm, normal heart sounds and intact distal pulses.   No murmur heard. Pulmonary/Chest: Effort normal and breath sounds normal. No respiratory distress.  Abdominal: Soft. She exhibits no distension. There is no tenderness.  Musculoskeletal: She exhibits no edema.  Lymphadenopathy:    She has no cervical adenopathy.  Neurological: She is alert and oriented to person, place, and time.  Skin: Skin is warm and dry.  Psychiatric: She has a normal mood and affect. Her behavior is normal.          Assessment & Plan:

## 2011-08-20 NOTE — Assessment & Plan Note (Signed)
New.  Likely multifactorial- 3rd shift, anxiety.  Pt has previously been on xanax.  Will switch to klonopin to see if sleeping improves.  Reviewed supportive care and red flags that should prompt return.  Pt expressed understanding and is in agreement w/ plan.

## 2011-08-20 NOTE — Patient Instructions (Signed)
Schedule your complete physical in 4-6 weeks- you can eat before this! We'll notify you of your lab results Start the Klonopin nightly as needed for sleep/anxiety Call with any questions or concerns Welcome!  We're glad to have you!

## 2011-08-23 LAB — VITAMIN D 1,25 DIHYDROXY
Vitamin D2 1, 25 (OH)2: <8 pg/mL
Vitamin D3 1, 25 (OH)2: 70 pg/mL

## 2011-08-24 ENCOUNTER — Encounter: Payer: Self-pay | Admitting: *Deleted

## 2011-09-03 ENCOUNTER — Emergency Department (HOSPITAL_COMMUNITY)
Admission: EM | Admit: 2011-09-03 | Discharge: 2011-09-03 | Disposition: A | Discharge status: Home / Self Care | Payer: 59 | Attending: Emergency Medicine | Admitting: Emergency Medicine

## 2011-09-03 ENCOUNTER — Emergency Department (HOSPITAL_COMMUNITY): Payer: 59

## 2011-09-03 ENCOUNTER — Encounter (HOSPITAL_COMMUNITY): Payer: Self-pay | Admitting: *Deleted

## 2011-09-03 DIAGNOSIS — Z9851 Tubal ligation status: Secondary | ICD-10-CM | POA: Insufficient documentation

## 2011-09-03 DIAGNOSIS — G43909 Migraine, unspecified, not intractable, without status migrainosus: Secondary | ICD-10-CM | POA: Insufficient documentation

## 2011-09-03 DIAGNOSIS — Z9889 Other specified postprocedural states: Secondary | ICD-10-CM | POA: Insufficient documentation

## 2011-09-03 DIAGNOSIS — IMO0001 Reserved for inherently not codable concepts without codable children: Secondary | ICD-10-CM | POA: Insufficient documentation

## 2011-09-03 DIAGNOSIS — N200 Calculus of kidney: Secondary | ICD-10-CM | POA: Insufficient documentation

## 2011-09-03 LAB — URINALYSIS, ROUTINE W REFLEX MICROSCOPIC
Bilirubin Urine: NEGATIVE
Nitrite: NEGATIVE
Protein, ur: NEGATIVE mg/dL
Specific Gravity, Urine: 1.014 (ref 1.005–1.030)
Urobilinogen, UA: 0.2 mg/dL (ref 0.0–1.0)

## 2011-09-03 LAB — URINE MICROSCOPIC-ADD ON

## 2011-09-03 MED ORDER — KETOROLAC TROMETHAMINE 60 MG/2ML IM SOLN
60.0000 mg | Freq: Once | INTRAMUSCULAR | Status: AC
  Administered 2011-09-03: 60 mg via INTRAMUSCULAR
  Filled 2011-09-03: qty 2

## 2011-09-03 MED ORDER — OXYCODONE-ACETAMINOPHEN 5-325 MG PO TABS: 1.0000 | ORAL_TABLET | Freq: Four times a day (QID) | ORAL | Status: DC | PRN

## 2011-09-03 MED ORDER — TAMSULOSIN HCL 0.4 MG PO CAPS: 0.4000 mg | ORAL_CAPSULE | Freq: Every day | ORAL | Status: DC

## 2011-09-03 NOTE — Discharge Instructions (Signed)
Kidney Stones      Kidney stones (ureteral lithiasis) are deposits that form inside your kidneys. The intense pain is caused by the stone moving through the urinary tract. When the stone moves, the ureter goes into spasm around the stone. The stone is usually passed in the urine.   CAUSES   · A disorder that makes certain neck glands produce too much parathyroid hormone (primary hyperparathyroidism).   · A buildup of uric acid crystals.   · Narrowing (stricture) of the ureter.   · A kidney obstruction present at birth (congenital obstruction).   · Previous surgery on the kidney or ureters.   · Numerous kidney infections.   SYMPTOMS   · Feeling sick to your stomach (nauseous).   · Throwing up (vomiting).   · Blood in the urine (hematuria).   · Pain that usually spreads (radiates) to the groin.   · Frequency or urgency of urination.   DIAGNOSIS   · Taking a history and physical exam.   · Blood or urine tests.   · Computerized X-ray scan (CT scan).   · Occasionally, an examination of the inside of the urinary bladder (cystoscopy) is performed.   TREATMENT   · Observation.   · Increasing your fluid intake.   · Surgery may be needed if you have severe pain or persistent obstruction.   The size, location, and chemical composition are all important variables that will determine the proper choice of action for you. Talk to your caregiver to better understand your situation so that you will minimize the risk of injury to yourself and your kidney.   HOME CARE INSTRUCTIONS   · Drink enough water and fluids to keep your urine clear or pale yellow.   · Strain all urine through the provided strainer. Keep all particulate matter and stones for your caregiver to see. The stone causing the pain may be as small as a grain of salt. It is very important to use the strainer each and every time you pass your urine. The collection of your stone will allow your caregiver to analyze it and verify that a stone has actually passed.   · Only  take over-the-counter or prescription medicines for pain, discomfort, or fever as directed by your caregiver.   · Make a follow-up appointment with your caregiver as directed.   · Get follow-up X-rays if required. The absence of pain does not always mean that the stone has passed. It may have only stopped moving. If the urine remains completely obstructed, it can cause loss of kidney function or even complete destruction of the kidney. It is your responsibility to make sure X-rays and follow-ups are completed. Ultrasounds of the kidney can show blockages and the status of the kidney. Ultrasounds are not associated with any radiation and can be performed easily in a matter of minutes.   SEEK IMMEDIATE MEDICAL CARE IF:   · Pain cannot be controlled with the prescribed medicine.   · You have a fever.   · The severity or intensity of pain increases over 18 hours and is not relieved by pain medicine.   · You develop a new onset of abdominal pain.   · You feel faint or pass out.   MAKE SURE YOU:   · Understand these instructions.   · Will watch your condition.   · Will get help right away if you are not doing well or get worse.   Document Released: 06/28/2005 Document Revised: 03/10/2011 Document Reviewed:   10/24/2009  [employer]® Patient Information ©2012 [employer].Kidney Stones     Kidney stones (ureteral lithiasis) are deposits that form inside your kidneys. The intense pain is caused by the stone moving through the urinary tract. When the stone moves, the ureter goes into spasm around the stone. The stone is usually passed in the urine.   CAUSES   · A disorder that makes certain neck glands produce too much parathyroid hormone (primary hyperparathyroidism).   · A buildup of uric acid crystals.   · Narrowing (stricture) of the ureter.   · A kidney obstruction present at birth (congenital obstruction).   · Previous surgery on the kidney or ureters.   · Numerous kidney infections.   SYMPTOMS   · Feeling sick to your  stomach (nauseous).   · Throwing up (vomiting).   · Blood in the urine (hematuria).   · Pain that usually spreads (radiates) to the groin.   · Frequency or urgency of urination.   DIAGNOSIS   · Taking a history and physical exam.   · Blood or urine tests.   · Computerized X-ray scan (CT scan).   · Occasionally, an examination of the inside of the urinary bladder (cystoscopy) is performed.   TREATMENT   · Observation.   · Increasing your fluid intake.   · Surgery may be needed if you have severe pain or persistent obstruction.   The size, location, and chemical composition are all important variables that will determine the proper choice of action for you. Talk to your caregiver to better understand your situation so that you will minimize the risk of injury to yourself and your kidney.   HOME CARE INSTRUCTIONS   · Drink enough water and fluids to keep your urine clear or pale yellow.   · Strain all urine through the provided strainer. Keep all particulate matter and stones for your caregiver to see. The stone causing the pain may be as small as a grain of salt. It is very important to use the strainer each and every time you pass your urine. The collection of your stone will allow your caregiver to analyze it and verify that a stone has actually passed.   · Only take over-the-counter or prescription medicines for pain, discomfort, or fever as directed by your caregiver.   · Make a follow-up appointment with your caregiver as directed.   · Get follow-up X-rays if required. The absence of pain does not always mean that the stone has passed. It may have only stopped moving. If the urine remains completely obstructed, it can cause loss of kidney function or even complete destruction of the kidney. It is your responsibility to make sure X-rays and follow-ups are completed. Ultrasounds of the kidney can show blockages and the status of the kidney. Ultrasounds are not associated with any radiation and can be performed  easily in a matter of minutes.   SEEK IMMEDIATE MEDICAL CARE IF:   · Pain cannot be controlled with the prescribed medicine.   · You have a fever.   · The severity or intensity of pain increases over 18 hours and is not relieved by pain medicine.   · You develop a new onset of abdominal pain.   · You feel faint or pass out.   MAKE SURE YOU:   · Understand these instructions.   · Will watch your condition.   · Will get help right away if you are not doing well or get worse.   Document

## 2011-09-03 NOTE — ED Notes (Signed)
Patient presents with c/o right flank pain for about 1 day  And right lower quad

## 2011-09-03 NOTE — ED Provider Notes (Signed)
History     CSN: [id number]  Arrival date & time 09/03/11  8657   First MD Initiated Contact with Patient 09/03/11 [id number]      Chief Complaint  Patient presents with  . Flank Pain    (Consider location/radiation/quality/duration/timing/severity/associated sxs/prior treatment) HPI Comments: History of kidney stones.  Does not feel unlike prior episodes.  No fever.  Reports some burning with urination.  Patient is a 30 y.o. female presenting with flank pain. The history is provided by the patient.  Flank Pain This is a new problem. The current episode started yesterday. Episode frequency: intermittent. The problem has been gradually worsening. Associated symptoms include abdominal pain. The symptoms are aggravated by nothing. The symptoms are relieved by nothing. She has tried nothing for the symptoms. The treatment provided no relief.    Past Medical History  Diagnosis Date  . History of ovarian cyst 02/2000    DERMOID, LEFT  . Fibromyalgia   . Migraine   . TMJ (temporomandibular joint syndrome)     Past Surgical History  Procedure Date  . Cystectomy     OVARIAN DERMOID  . Tubal ligation   . Appendectomy     AGE 12    Family History  Problem Relation Age of Onset  . Hypertension Father   . Alcohol abuse Father   . Breast cancer Maternal Aunt   . Cancer Paternal Grandmother     COLON , PANCREATIC  . Hyperlipidemia Mother     History  Substance Use Topics  . Smoking status: Never Smoker   . Smokeless tobacco: Never Used  . Alcohol Use: No    OB History    Grav Para Term Preterm Abortions TAB SAB Ect Mult Living   4 4        4       Review of Systems  Gastrointestinal: Positive for abdominal pain.  Genitourinary: Positive for flank pain.  All other systems reviewed and are negative.    Allergies  Penicillins  Home Medications   Current Outpatient Rx  Name Route Sig Dispense Refill  . CLONAZEPAM 0.5 MG PO TABS Oral Take 0.5 mg by mouth at bedtime as  needed. For sleep    . ORTHO TRI-CYCLEN (28) PO Oral Take 1 tablet by mouth daily.    . TRAMADOL HCL 50 MG PO TABS Oral Take 50 mg by mouth every 6 (six) hours as needed. For pain      LMP 07/26/2011  Physical Exam  Nursing note and vitals reviewed. Constitutional: She is oriented to person, place, and time. She appears well-developed and well-nourished. No distress.  HENT:  Head: Normocephalic and atraumatic.  Neck: Normal range of motion. Neck supple.  Cardiovascular: Normal rate and regular rhythm.   No murmur heard. Pulmonary/Chest: Effort normal and breath sounds normal. No respiratory distress.  Abdominal: Soft. Bowel sounds are normal. She exhibits no distension. There is no tenderness.  Genitourinary:       There is mild cva ttp on the right.  Musculoskeletal: Normal range of motion. She exhibits no edema.  Neurological: She is alert and oriented to person, place, and time.  Skin: Skin is warm and dry. She is not diaphoretic.    ED Course  Procedures (including critical care time)   Labs Reviewed  URINALYSIS, ROUTINE W REFLEX MICROSCOPIC  PREGNANCY, URINE   No results found.   No diagnosis found.    MDM  CT shows renal calculus.  Feels better.  Will discharge with pain meds,  flomax.        Geoffery Lyons, MD 09/03/11 [phone]

## 2011-09-08 ENCOUNTER — Other Ambulatory Visit: Payer: Self-pay | Admitting: Urology

## 2011-09-09 ENCOUNTER — Encounter (HOSPITAL_COMMUNITY)
Admission: RE | Admit: 2011-09-09 | Discharge: 2011-09-09 | Disposition: A | Discharge status: Home / Self Care | Payer: 59 | Source: Ambulatory Visit | Attending: Urology | Admitting: Urology

## 2011-09-09 ENCOUNTER — Encounter (HOSPITAL_COMMUNITY): Payer: Self-pay | Admitting: Pharmacy Technician

## 2011-09-09 ENCOUNTER — Encounter (HOSPITAL_COMMUNITY): Payer: Self-pay

## 2011-09-09 HISTORY — DX: Anemia, unspecified: D64.9

## 2011-09-09 HISTORY — DX: Calculus of ureter: N20.1

## 2011-09-09 LAB — CBC
HCT: 34.7 % — ABNORMAL LOW (ref 36.0–46.0)
Hemoglobin: 10.9 g/dL — ABNORMAL LOW (ref 12.0–15.0)
MCHC: 31.4 g/dL (ref 30.0–36.0)
RBC: 4.61 MIL/uL (ref 3.87–5.11)

## 2011-09-09 LAB — SURGICAL PCR SCREEN: Staphylococcus aureus: NEGATIVE

## 2011-09-09 NOTE — Patient Instructions (Addendum)
[address]  09/09/2011   Your procedure is scheduled on:  09-13-2011  Report to [facility] at  1100 AM.  Call this number if you have problems the morning of surgery: [phone]   Remember:   Clear liquids midnight until 0730am, then nothing by mouth.  .  Take these medicines the morning of surgery with A SIP OF WATER: hydrocodone   Do not wear jewelry or make up.  Do not wear lotions, powders, or perfumes.Do not wear deodorant.    Do not bring valuables to the hospital.  Contacts, dentures or bridgework may not be worn into surgery.  Leave suitcase in the car. After surgery it may be brought to your room.  For patients admitted to the hospital, checkout time is 11:00 AM the day of discharge.     Special Instructions: CHG Shower Use Special Wash: 1/2 bottle night before surgery and 1/2 bottle morning of surgery.neck down avoid private area, no shaving for 2 days before showers   Please read over the following fact sheets that you were given: MRSA Information  [facility] pre op nurse phone number [phone], call if needed

## 2011-09-13 ENCOUNTER — Encounter (HOSPITAL_COMMUNITY): Payer: Self-pay | Admitting: Anesthesiology

## 2011-09-13 ENCOUNTER — Encounter (HOSPITAL_COMMUNITY): Payer: Self-pay | Admitting: *Deleted

## 2011-09-13 ENCOUNTER — Encounter (HOSPITAL_COMMUNITY)
Admission: RE | Disposition: A | Discharge status: Home / Self Care | Payer: Self-pay | Source: Ambulatory Visit | Attending: Urology

## 2011-09-13 ENCOUNTER — Ambulatory Visit (HOSPITAL_COMMUNITY)
Admission: RE | Admit: 2011-09-13 | Discharge: 2011-09-13 | Disposition: A | Discharge status: Home / Self Care | Payer: 59 | Source: Ambulatory Visit | Attending: Urology | Admitting: Urology

## 2011-09-13 ENCOUNTER — Ambulatory Visit (HOSPITAL_COMMUNITY): Payer: 59 | Admitting: Anesthesiology

## 2011-09-13 ENCOUNTER — Telehealth: Payer: Self-pay | Admitting: Family Medicine

## 2011-09-13 DIAGNOSIS — Z79899 Other long term (current) drug therapy: Secondary | ICD-10-CM | POA: Insufficient documentation

## 2011-09-13 DIAGNOSIS — J45909 Unspecified asthma, uncomplicated: Secondary | ICD-10-CM | POA: Insufficient documentation

## 2011-09-13 DIAGNOSIS — Z01812 Encounter for preprocedural laboratory examination: Secondary | ICD-10-CM | POA: Insufficient documentation

## 2011-09-13 DIAGNOSIS — N201 Calculus of ureter: Secondary | ICD-10-CM | POA: Insufficient documentation

## 2011-09-13 DIAGNOSIS — IMO0001 Reserved for inherently not codable concepts without codable children: Secondary | ICD-10-CM | POA: Insufficient documentation

## 2011-09-13 SURGERY — CYSTOURETEROSCOPY, WITH RETROGRADE PYELOGRAM AND STENT INSERTION
Anesthesia: General | Laterality: Right | Wound class: Clean Contaminated

## 2011-09-13 MED ORDER — FENTANYL CITRATE 0.05 MG/ML IJ SOLN
25.0000 ug | INTRAMUSCULAR | Status: DC | PRN
  Administered 2011-09-13 (×3): 50 ug via INTRAVENOUS

## 2011-09-13 MED ORDER — HYDROCODONE-ACETAMINOPHEN 5-500 MG PO CAPS: 1.0000 | ORAL_CAPSULE | Freq: Four times a day (QID) | ORAL | Status: AC | PRN

## 2011-09-13 MED ORDER — ACETAMINOPHEN 10 MG/ML IV SOLN
INTRAVENOUS | Status: DC | PRN
  Administered 2011-09-13: 1000 mg via INTRAVENOUS

## 2011-09-13 MED ORDER — FENTANYL CITRATE 0.05 MG/ML IJ SOLN
INTRAMUSCULAR | Status: DC | PRN
  Administered 2011-09-13 (×2): 50 ug via INTRAVENOUS

## 2011-09-13 MED ORDER — LACTATED RINGERS IV SOLN: INTRAVENOUS | Status: DC

## 2011-09-13 MED ORDER — ONDANSETRON HCL 4 MG/2ML IJ SOLN
4.0000 mg | Freq: Once | INTRAMUSCULAR | Status: AC
  Administered 2011-09-13: 4 mg via INTRAVENOUS

## 2011-09-13 MED ORDER — ACETAMINOPHEN 10 MG/ML IV SOLN
INTRAVENOUS | Status: AC
  Filled 2011-09-13: qty 100

## 2011-09-13 MED ORDER — ONDANSETRON HCL 4 MG/2ML IJ SOLN
INTRAMUSCULAR | Status: AC
  Filled 2011-09-13: qty 2

## 2011-09-13 MED ORDER — IOHEXOL 300 MG/ML  SOLN
INTRAMUSCULAR | Status: AC
  Filled 2011-09-13: qty 1

## 2011-09-13 MED ORDER — LIDOCAINE HCL (CARDIAC) 20 MG/ML IV SOLN
INTRAVENOUS | Status: DC | PRN
  Administered 2011-09-13: 100 mg via INTRAVENOUS

## 2011-09-13 MED ORDER — HYDROCODONE-ACETAMINOPHEN 5-325 MG PO TABS
1.0000 | ORAL_TABLET | Freq: Four times a day (QID) | ORAL | Status: DC | PRN
  Administered 2011-09-13: 1 via ORAL

## 2011-09-13 MED ORDER — DROPERIDOL 2.5 MG/ML IJ SOLN
0.6250 mg | INTRAMUSCULAR | Status: DC | PRN
  Filled 2011-09-13: qty 0.25

## 2011-09-13 MED ORDER — CIPROFLOXACIN IN D5W 400 MG/200ML IV SOLN
400.0000 mg | INTRAVENOUS | Status: AC
  Administered 2011-09-13: 400 mg via INTRAVENOUS

## 2011-09-13 MED ORDER — IOHEXOL 300 MG/ML  SOLN
INTRAMUSCULAR | Status: DC | PRN
  Administered 2011-09-13: 10 mL via INTRAVENOUS

## 2011-09-13 MED ORDER — MIDAZOLAM HCL 5 MG/5ML IJ SOLN
INTRAMUSCULAR | Status: DC | PRN
  Administered 2011-09-13 (×2): 1 mg via INTRAVENOUS

## 2011-09-13 MED ORDER — CIPROFLOXACIN IN D5W 400 MG/200ML IV SOLN
INTRAVENOUS | Status: AC
  Filled 2011-09-13: qty 200

## 2011-09-13 MED ORDER — FENTANYL CITRATE 0.05 MG/ML IJ SOLN
INTRAMUSCULAR | Status: AC
  Filled 2011-09-13: qty 2

## 2011-09-13 MED ORDER — LACTATED RINGERS IV SOLN
INTRAVENOUS | Status: DC
  Administered 2011-09-13: 14:00:00 via INTRAVENOUS
  Administered 2011-09-13: 1000 mL via INTRAVENOUS

## 2011-09-13 MED ORDER — HYDROCODONE-ACETAMINOPHEN 5-325 MG PO TABS
ORAL_TABLET | ORAL | Status: AC
  Filled 2011-09-13: qty 1

## 2011-09-13 MED ORDER — DEXAMETHASONE SODIUM PHOSPHATE 10 MG/ML IJ SOLN
INTRAMUSCULAR | Status: DC | PRN
  Administered 2011-09-13: 6 mg via INTRAVENOUS

## 2011-09-13 MED ORDER — PROPOFOL 10 MG/ML IV EMUL
INTRAVENOUS | Status: DC | PRN
  Administered 2011-09-13: 200 mg via INTRAVENOUS

## 2011-09-13 MED ORDER — MEPERIDINE HCL 50 MG/ML IJ SOLN: 6.2500 mg | INTRAMUSCULAR | Status: DC | PRN

## 2011-09-13 SURGICAL SUPPLY — 18 items
ADAPTER CATH URET PLST 4-6FR (CATHETERS) ×2 IMPLANT
ADPR CATH URET STRL DISP 4-6FR (CATHETERS) ×1
BAG URO CATCHER STRL LF (DRAPE) ×2 IMPLANT
BASKET ZERO TIP NITINOL 2.4FR (BASKET) IMPLANT
BSKT STON RTRVL ZERO TP 2.4FR (BASKET)
CATH CLEAR GEL 3F BACKSTOP (CATHETERS) ×1 IMPLANT
CATH INTERMIT  6FR 70CM (CATHETERS) IMPLANT
CLOTH BEACON ORANGE TIMEOUT ST (SAFETY) ×2 IMPLANT
DRAPE CAMERA CLOSED 9X96 (DRAPES) ×2 IMPLANT
GLOVE BIOGEL M STRL SZ7.5 (GLOVE) ×2 IMPLANT
GOWN PREVENTION PLUS XLARGE (GOWN DISPOSABLE) ×2 IMPLANT
GOWN STRL NON-REIN LRG LVL3 (GOWN DISPOSABLE) ×4 IMPLANT
GUIDEWIRE ANG ZIPWIRE 038X150 (WIRE) IMPLANT
GUIDEWIRE STR DUAL SENSOR (WIRE) ×2 IMPLANT
LASER FIBER DISP (UROLOGICAL SUPPLIES) ×1 IMPLANT
MANIFOLD NEPTUNE II (INSTRUMENTS) ×2 IMPLANT
PACK CYSTO (CUSTOM PROCEDURE TRAY) ×2 IMPLANT
TUBING CONNECTING 10 (TUBING) ×2 IMPLANT

## 2011-09-13 NOTE — Anesthesia Postprocedure Evaluation (Signed)
  Anesthesia Post-op Note  Patient: Jenna Gray  Procedure(s) Performed: Procedure(s) (LRB): CYSTOSCOPY WITH RETROGRADE PYELOGRAM, URETEROSCOPY AND STENT PLACEMENT (Right) HOLMIUM LASER APPLICATION (Right)  Patient Location: PACU  Anesthesia Type: General  Level of Consciousness: awake and alert   Airway and Oxygen Therapy: Patient Spontanous Breathing  Post-op Pain: mild  Post-op Assessment: Post-op Vital signs reviewed, Patient's Cardiovascular Status Stable, Respiratory Function Stable, Patent Airway and No signs of Nausea or vomiting  Post-op Vital Signs: stable  Complications: No apparent anesthesia complications

## 2011-09-13 NOTE — Preoperative (Signed)
Beta Blockers   Reason not to administer Beta Blockers:Not Applicable 

## 2011-09-13 NOTE — Transfer of Care (Signed)
Immediate Anesthesia Transfer of Care Note  Patient: Jenna Gray  Procedure(s) Performed: Procedure(s) (LRB): CYSTOSCOPY WITH RETROGRADE PYELOGRAM, URETEROSCOPY AND STENT PLACEMENT (Right) HOLMIUM LASER APPLICATION (Right)  Patient Location: PACU  Anesthesia Type: General  Level of Consciousness: awake and oriented  Airway & Oxygen Therapy: Patient Spontanous Breathing and Patient connected to face mask oxygen  Post-op Assessment: Report given to PACU RN and Post -op Vital signs reviewed and stable  Post vital signs: Reviewed and stable  Complications: No apparent anesthesia complications

## 2011-09-13 NOTE — Op Note (Signed)
Preoperative diagnosis: Right ureteral calculus  Postoperative diagnosis: Right ureteral calculus  Procedure:  1. Cystoscopy 2. Right ureteroscopy and stone removal 3. Ureteroscopic laser lithotripsy 4. Right retrograde pyelography with interpretation  Surgeon: Moody Bruins. M.D.  Anesthesia: General  Complications: None  Intraoperative findings: Right retrograde pyelography demonstrated a filling defect within the right ureter consistent with the patient's known calculus without other abnormalities.  EBL: Minimal  Specimens: 1. Right ureteral calculus  Disposition of specimens: [facility] for stone analysis  Indication: Jenna Gray is a 31 y.o. year old patient with urolithiasis. After reviewing the management options for treatment, the patient elected to proceed with the above surgical procedure(s). We have discussed the potential benefits and risks of the procedure, side effects of the proposed treatment, the likelihood of the patient achieving the goals of the procedure, and any potential problems that might occur during the procedure or recuperation. Informed consent has been obtained.  Description of procedure:  The patient was taken to the operating room and general anesthesia was induced.  The patient was placed in the dorsal lithotomy position, prepped and draped in the usual sterile fashion, and preoperative antibiotics were administered. A preoperative time-out was performed.   Cystourethroscopy was performed.  The patient's urethra was examined and was normal. The bladder was then systematically examined in its entirety. There was no evidence for any bladder tumors, stones, or other mucosal pathology.    Attention then turned to the right ureteral orifice and a ureteral catheter was used to intubate the ureteral orifice.  Omnipaque contrast was injected through the ureteral catheter and a retrograde pyelogram was performed with findings as  dictated above.  A 0.38 sensor guidewire was then advanced up the right ureter into the renal pelvis under fluoroscopic guidance. The 6 Fr semirigid ureteroscope was then advanced into the ureter next to the guidewire and the calculus was identified. BackStop was then injected 2 cm above the level of the stone into the ureter to prevent fragment migration.   The stone was then fragmented with the 365 micron holmium laser fiber on a setting of 0.6J and frequency of 8 Hz.   All stones fragments were able to be flushed into the bladder and were subsequently removed with the cystoscope sheath.  Reinspection of the ureter revealed no remaining visible stones or fragments.   It was not felt that a stent would be necessary.  The bladder was then emptied and the procedure ended.  The patient appeared to tolerate the procedure well and without complications.  The patient was able to be awakened and transferred to the recovery unit in satisfactory condition.

## 2011-09-13 NOTE — H&P (Signed)
History of Present Illness   Ms. Jenna Gray is a 31 year old woman who works at [employer]. On Friday she had a lot of flank pain and went to the emergency room and had a CT scan which demonstrated a 6 x 4 mm stone in distal right ureter 2 cm proximal to the right ureterovesical junction and a second stone 9 x 6 mm proximal to the distal stone. She had perinephric stranding.   Her pain is present daily but it comes and goes and waxes and wanes in severity. The pain medicines help significantly. She has noticed some blood in her urine. She has been straining her urine and has not visualized any gravel or sand. She may have a mild increase in frequency due to increased fluid intake. She has had no fever.   There is no other modifying factors or associated signs or symptoms. There is no other aggravating or relieving factors. She is on Flomax and passed a stone years ago. She had a urinary tract infection when she was pregnant but normally does not. She denies a history of previous GU surgery.  Normally she voids every 2-3 hours and gets up 0-1 time at night. She has no neurologic risk factors or symptoms. She is not on any blood thinners. She has not had a hysterectomy. Her bowel function is normal.   When I reviewed the CT scan, I must say that Jenna Gray' ureter is markedly dilated and a little bit tortuous.    Past Medical History Problems  1. History of  Asthma 493.90 2. History of  Fibromyalgia 729.1 3. History of  Migraine Headache 346.90 4. History of  Ovarian Cyst 620.2  Surgical History Problems  1. History of  Appendectomy 2. History of  Ovarian Cystectomy 3. History of  Tubal Ligation V25.2  Current Meds 1. Birth Control Pill; Therapy: (Recorded:27Feb2013) to 2. ClonazePAM 0.5 MG Oral Tablet; Therapy: (Recorded:27Feb2013) to 3. Oxycodone-Acetaminophen 5-325 MG Oral Tablet; Therapy: (Recorded:27Feb2013) to 4. TraMADol HCl 50 MG Oral Tablet; Therapy: (Recorded:27Feb2013)  to  Allergies Medication  1. Penicillins  Family History Problems  1. Paternal history of  Alcohol Abuse 2. Maternal aunt's history of  Breast Cancer V16.3 3. Paternal grandmother's history of  Colon Cancer V16.0 4. Paternal history of  Hypertension V17.49 5. Paternal grandmother's history of  Pancreatic Cirrhosis  Social History Problems    Marital History - Single   Never A Smoker   Occupation: Nursing administration Denied    History of  Alcohol Use   History of  Tobacco Use  Review of Systems Genitourinary, constitutional, skin, eye, otolaryngeal, hematologic/lymphatic, cardiovascular, pulmonary, endocrine, neurological and psychiatric system(s) were reviewed and pertinent findings if present are noted.  Gastrointestinal: nausea.  Musculoskeletal: back pain and joint pain.    Vitals Vital Signs [Data Includes: Last 1 Day]  27Feb2013 10:32AM  BMI Calculated: 32.1 BSA Calculated: 1.76 Height: 5 ft 1 in Weight: 170 lb  Blood Pressure: 125 / 81 Temperature: 98.7 F Heart Rate: 82  Physical Exam Constitutional: Well nourished and well developed . No acute distress.  ENT:. The ears and nose are normal in appearance.  Neck: The appearance of the neck is normal and no neck mass is present.  Pulmonary: No respiratory distress and normal respiratory rhythm and effort.  Cardiovascular: Heart rate and rhythm are normal . No peripheral edema.  Abdomen: The abdomen is soft and nontender. No masses are palpated. No CVA tenderness. No hernias are palpable. No hepatosplenomegaly noted.  Lymphatics: The  femoral and inguinal nodes are not enlarged or tender.  Skin: Normal skin turgor, no visible rash and no visible skin lesions.  Neuro/Psych:. Mood and affect are appropriate.   . Genitourinary: On abdominal examination Jenna Gray had no CVA or abdominal tenderness.  She was not toxic and she looked comfortable.    Results/Data    Today she underwent a number of tests  which I personally reviewed. Urinalysis: Negative.   Review of Medical Records: I reviewed the medical records and dictated the findings in the history of present illness.   I personally reviewed the CT scan. Urine [Data Includes: Last 1 Day]   27Feb2013  COLOR YELLOW   APPEARANCE CLEAR   SPECIFIC GRAVITY 1.025   pH 6.0   GLUCOSE NEG mg/dL  BILIRUBIN NEG   KETONE NEG mg/dL  BLOOD TRACE   PROTEIN NEG mg/dL  UROBILINOGEN 0.2 mg/dL  NITRITE NEG   LEUKOCYTE ESTERASE NEG   SQUAMOUS EPITHELIAL/HPF FEW   WBC 4-6 WBC/hpf  RBC 0-3 RBC/hpf  BACTERIA RARE   CRYSTALS NONE SEEN   CASTS NONE SEEN   Other SLT MUCUS    Assessment Assessed  1. Urinary Calculus On The Right 592.9  Plan   Discussion/Summary   Jenna Gray presented with 2 distal right ureteral stones. She is having ongoing colic well controlled with pain medicine. She is on Flomax and straining her urine.   I reviewed Jenna Gray' KUB with Dr. Laverle Patter. She has a very small fragment distally and a 7 or 8 mm stone that is quite round just proximal to the uterovesical junction.   I drew Jenna Gray a picture. I went over cystoscopy, right retrograde ureterogram laser lithotripsy plus or minus stent in detail. Risks included bleeding, infection, pain, failure and stent issues were discussed. Injury to ureter requiring further surgery or procedure was also discussed.   We talked about lithotripsy.   We talked about ESWL in detail. Pros, cons, general surgical and anesthetic risks, and other options including watchful waiting and ureteroscopy were discussed. Success and failure rates and need for further/repeat therapy were discussed. Risks were described but not limited to pain, infection, sepsis, and bleeding. The risk of renal and ureteral trauma with short and long term sequelae was discussed. The risk of injury to adjacent structures was discussed. The risk of needing a stent post-ESWL was discussed.  Jenna Gray would  like to proceed with ureteroscopy and I think this is the best option for her, especially with the lead fragment and the stone being more distal. Dr. Laverle Patter had noted that he could do this on Monday and she was fine with this. Indication to go to the [facility] ER or call the office was discussed.   Rapaflo samples were also given.      Signatures Electronically signed by : Alfredo Martinez, M.D.; Sep 08 2011  3:06PM

## 2011-09-13 NOTE — Anesthesia Procedure Notes (Signed)
Procedure Name: LMA Insertion Date/Time: 09/13/2011 12:43 PM Performed by: Lurlean Leyden, Linzie Criss L. Patient Re-evaluated:Patient Re-evaluated prior to inductionOxygen Delivery Method: Circle system utilized Preoxygenation: Pre-oxygenation with 100% oxygen Intubation Type: IV induction Ventilation: Mask ventilation without difficulty LMA: LMA inserted LMA Size: 4.0 Tube type: Oral Number of attempts: 1 Placement Confirmation: breath sounds checked- equal and bilateral and positive ETCO2 Tube secured with: Tape Dental Injury: Teeth and Oropharynx as per pre-operative assessment

## 2011-09-13 NOTE — Telephone Encounter (Signed)
RX refill for     traMADol (ULTRAM) 50 MG tablet   Send to [facility]  Thanks

## 2011-09-13 NOTE — Interval H&P Note (Signed)
History and Physical Interval Note:  09/13/2011 12:30 PM  Jenna Gray  has presented today for surgery, with the diagnosis of Right Ureteral Stone  The various methods of treatment have been discussed with the patient and family. After consideration of risks, benefits and other options for treatment, the patient has consented to  Procedure(s) (LRB): CYSTOSCOPY WITH RETROGRADE PYELOGRAM, URETEROSCOPY AND STENT PLACEMENT (Right) HOLMIUM LASER APPLICATION (Right) as a surgical intervention .  The patients' history has been reviewed, patient examined, no change in status, stable for surgery.  I have reviewed the patients' chart and labs.  Questions were answered to the patient's satisfaction.     Woodie Trusty,LES

## 2011-09-13 NOTE — Discharge Instructions (Signed)
1. You may see some blood in the urine and may have some burning with urination for 48-72 hours. You also may notice that you have to urinate more frequently or urgently after your procedure which is normal.  °2. You should call should you develop an inability urinate, fever > 101, persistent nausea and vomiting that prevents you from eating or drinking to stay hydrated.  °

## 2011-09-13 NOTE — Anesthesia Preprocedure Evaluation (Addendum)
Anesthesia Evaluation  Patient identified by MRN, date of birth, ID band Patient awake    Reviewed: Allergy & Precautions, H&P , NPO status , Patient's Chart, lab work & pertinent test results  Airway Mallampati: II TM Distance: >3 FB Neck ROM: Full    Dental No notable dental hx.    Pulmonary neg pulmonary ROS,  breath sounds clear to auscultation  Pulmonary exam normal       Cardiovascular negative cardio ROS  Rhythm:Regular Rate:Normal     Neuro/Psych  Neuromuscular disease negative neurological ROS  negative psych ROS   GI/Hepatic negative GI ROS, Neg liver ROS,   Endo/Other  negative endocrine ROS  Renal/GU negative Renal ROS  negative genitourinary   Musculoskeletal negative musculoskeletal ROS (+) Fibromyalgia -  Abdominal   Peds negative pediatric ROS (+)  Hematology negative hematology ROS (+)   Anesthesia Other Findings   Reproductive/Obstetrics negative OB ROS                          Anesthesia Physical Anesthesia Plan  ASA: II  Anesthesia Plan: General   Post-op Pain Management:    Induction: Intravenous  Airway Management Planned:   Additional Equipment:   Intra-op Plan:   Post-operative Plan: Extubation in OR  Informed Consent: I have reviewed the patients History and Physical, chart, labs and discussed the procedure including the risks, benefits and alternatives for the proposed anesthesia with the patient or authorized representative who has indicated his/her understanding and acceptance.   Dental advisory given  Plan Discussed with: CRNA  Anesthesia Plan Comments:         Anesthesia Quick Evaluation

## 2011-09-14 NOTE — Telephone Encounter (Signed)
Pt has upcoming appt on 3/13- can discuss tramadol at that time b/c i'm not sure what she's taking it for

## 2011-09-14 NOTE — Telephone Encounter (Signed)
Last OV 08-20-11 noted no refills for medication in centricity, noted last entry for the tramadol was discontinued by RX tech on 09-03-11 unable to locate last refill

## 2011-09-14 NOTE — Telephone Encounter (Signed)
Noted  

## 2011-09-15 ENCOUNTER — Other Ambulatory Visit: Payer: Self-pay

## 2011-09-15 MED ORDER — TRAMADOL HCL 50 MG PO TABS: 50.0000 mg | ORAL_TABLET | Freq: Four times a day (QID) | ORAL | Status: DC | PRN

## 2011-09-15 NOTE — Telephone Encounter (Signed)
Okay to refill? 

## 2011-09-15 NOTE — Telephone Encounter (Signed)
Pt medication has been sent in from [facility] to pharmacy noted in chart, left message for pt to call office to advise.

## 2011-09-22 ENCOUNTER — Ambulatory Visit (INDEPENDENT_AMBULATORY_CARE_PROVIDER_SITE_OTHER): Payer: 59 | Admitting: Family Medicine

## 2011-09-22 ENCOUNTER — Encounter: Payer: Self-pay | Admitting: Family Medicine

## 2011-09-22 DIAGNOSIS — K219 Gastro-esophageal reflux disease without esophagitis: Secondary | ICD-10-CM

## 2011-09-22 DIAGNOSIS — Z0000 Encounter for general adult medical examination without abnormal findings: Secondary | ICD-10-CM

## 2011-09-22 MED ORDER — TRAMADOL HCL 50 MG PO TABS: 50.0000 mg | ORAL_TABLET | Freq: Four times a day (QID) | ORAL | Status: DC | PRN

## 2011-09-22 MED ORDER — RANITIDINE HCL 150 MG PO TABS: 150.0000 mg | ORAL_TABLET | Freq: Two times a day (BID) | ORAL | Status: DC

## 2011-09-22 NOTE — Assessment & Plan Note (Signed)
Pt's PE WNL.  UTD on GYN.  Labs reviewed from last visit.  Anticipatory guidance provided.

## 2011-09-22 NOTE — Assessment & Plan Note (Signed)
New.  Start Zantac.  Reviewed lifestyle and dietary modifications.  Reviewed supportive care and red flags that should prompt return.  Pt expressed understanding and is in agreement w/ plan.

## 2011-09-22 NOTE — Patient Instructions (Signed)
You look great!  Keep up the good work! Start the Zantac twice daily for 2 weeks and then as needed for the heartburn We'll repeat your cholesterol this summer Call with any questions or concerns Happy Spring!!

## 2011-09-22 NOTE — Progress Notes (Signed)
  Subjective:    Patient ID: Jenna Gray, female    DOB: [birth year], 31 y.o.   MRN: [id number]  HPI CPE- GYN- Lily Peer, UTD on pap.  Uro- [place]orden  GERD- pt reports sxs are occuring more frequently.  Fearful to take Tums due to recent kidney stones.  Keeping her up at night.  No relation to particular type of food- spicy, etc.   Review of Systems Patient reports no vision/ hearing changes, adenopathy,fever, weight change,  persistant/recurrent hoarseness , swallowing issues, chest pain, palpitations, edema, persistant/recurrent cough, hemoptysis, dyspnea (rest/exertional/paroxysmal nocturnal), gastrointestinal bleeding (melena, rectal bleeding), abdominal pain, significant heartburn, bowel changes, GU symptoms (dysuria, hematuria, incontinence), Gyn symptoms (abnormal  bleeding, pain),  syncope, focal weakness, memory loss, numbness & tingling, skin/hair/nail changes, abnormal bruising or bleeding, anxiety, or depression.     Objective:   Physical Exam General Appearance:    Alert, cooperative, no distress, appears stated age  Head:    Normocephalic, without obvious abnormality, atraumatic  Eyes:    PERRL, conjunctiva/corneas clear, EOM's intact, fundi    benign, both eyes  Ears:    Normal TM's and external ear canals, both ears  Nose:   Nares normal, septum midline, mucosa normal, no drainage    or sinus tenderness  Throat:   Lips, mucosa, and tongue normal; teeth and gums normal  Neck:   Supple, symmetrical, trachea midline, no adenopathy;    Thyroid: no enlargement/tenderness/nodules  Back:     Symmetric, no curvature, ROM normal, no CVA tenderness  Lungs:     Clear to auscultation bilaterally, respirations unlabored  Chest Wall:    No tenderness or deformity   Heart:    Regular rate and rhythm, S1 and S2 normal, no murmur, rub   or gallop  Breast Exam:    Deferred to GYN  Abdomen:     Soft, non-tender, bowel sounds active all four quadrants,    no masses, no organomegaly    Genitalia:    Deferred to GYN  Rectal:    Extremities:   Extremities normal, atraumatic, no cyanosis or edema  Pulses:   2+ and symmetric all extremities  Skin:   Skin color, texture, turgor normal, no rashes or lesions  Lymph nodes:   Cervical, supraclavicular, and axillary nodes normal  Neurologic:   CNII-XII intact, normal strength, sensation and reflexes    throughout          Assessment & Plan:

## 2011-09-28 ENCOUNTER — Telehealth: Payer: Self-pay | Admitting: Family Medicine

## 2011-09-28 NOTE — Telephone Encounter (Signed)
Rx sent to [place] outpatient pharm 09/22/11  #120.     KP

## 2011-09-28 NOTE — Telephone Encounter (Signed)
Refill for  Tramadol hcl 50mg  tablet Qty 120  Take 1-tablet by mouth every 6-hrs as needed  last filled 2.08.13

## 2011-10-13 ENCOUNTER — Telehealth: Payer: Self-pay | Admitting: Family Medicine

## 2011-10-13 NOTE — Telephone Encounter (Signed)
Refill: Tramadol 50mg  tablet. Take 1 tablt by mouth every 6 hours as needed for pain. Qty 120. Last fill 09-22-11

## 2011-10-15 ENCOUNTER — Telehealth: Payer: Self-pay | Admitting: Family Medicine

## 2011-10-15 ENCOUNTER — Other Ambulatory Visit: Payer: Self-pay

## 2011-10-15 MED ORDER — TRAMADOL HCL 50 MG PO TABS: 50.0000 mg | ORAL_TABLET | Freq: Four times a day (QID) | ORAL | Status: DC | PRN

## 2011-10-15 NOTE — Telephone Encounter (Signed)
Pt just got 120 tramadol on 3/13- should not be requiring this med so frequently.

## 2011-10-15 NOTE — Telephone Encounter (Deleted)
Got

## 2011-10-15 NOTE — Telephone Encounter (Signed)
Pt notified per another phone note, per [facility] advised to refill medication, pt aware, sent Tramadol 50mg  #120 to [facility]acey requested via escribe

## 2011-10-15 NOTE — Telephone Encounter (Signed)
Called pt to advise the medication has been sent to the pharmacy she requested, pt understood, medication sent for Tramadol 50mg  #120 via escribe

## 2011-10-15 NOTE — Telephone Encounter (Signed)
Ok for #120, 1 refill 

## 2011-10-15 NOTE — Telephone Encounter (Signed)
Patient left a message stating she would like the Tramadol sent to [facility] at [facility] because she won't be able to pick this RX up until this weekend and the [facility] will be closed. Please advise? Last refill 09-22-11

## 2011-10-15 NOTE — Telephone Encounter (Deleted)
3rd request for Tramadol It looks like we sent to the to Should be sent to [facility]

## 2011-10-15 NOTE — Telephone Encounter (Signed)
Got 3rd req from [facility]. Looks like patient called in adn advised to send elsewhere. Faxed that back to [facility] 4.5

## 2011-10-15 NOTE — Telephone Encounter (Signed)
Called pt to advise instructions per pt notes that she is taking 4 tramadol daily, pt notes that somedays are worse than others but this is for her fibromyalgia, pt also noted that she requested the medication early per notes that it can take a few days for a prescription to be filled, please advise

## 2011-10-26 ENCOUNTER — Telehealth: Payer: Self-pay | Admitting: Family Medicine

## 2011-10-26 NOTE — Telephone Encounter (Signed)
Last OV 09-22-11 last refill 08-20-11 #30 with 1 refill

## 2011-10-26 NOTE — Telephone Encounter (Signed)
Refill: Clonazepam .5 mg tablet #30. Take 1 tablet by mouth at bedtime as needed for sleep. Last fill 09-29-11

## 2011-10-27 MED ORDER — CLONAZEPAM 0.5 MG PO TABS: 0.5000 mg | ORAL_TABLET | Freq: Every evening | ORAL | Status: DC | PRN

## 2011-10-27 NOTE — Telephone Encounter (Signed)
Ok for #30, no refills 

## 2011-10-27 NOTE — Telephone Encounter (Signed)
.  rx faxed to pharmacy, manually.  

## 2011-11-05 ENCOUNTER — Telehealth: Payer: Self-pay | Admitting: Family Medicine

## 2011-11-05 MED ORDER — TRAMADOL HCL 50 MG PO TABS: 50.0000 mg | ORAL_TABLET | Freq: Four times a day (QID) | ORAL | Status: DC | PRN

## 2011-11-05 NOTE — Telephone Encounter (Signed)
Last seen 09/22/11 and filled 10/15/11 # 120. please advise     KP

## 2011-11-05 NOTE — Telephone Encounter (Signed)
Ok for #120 

## 2011-11-05 NOTE — Telephone Encounter (Signed)
Refill: Tramadol 50mg  tablets. Take 1 tablet by mouth every 6 hours as needed for pain. Qty 120. Last fill 4.5.13

## 2011-11-08 ENCOUNTER — Telehealth: Payer: Self-pay | Admitting: *Deleted

## 2011-11-08 NOTE — Telephone Encounter (Signed)
Discuss with patient, Appt scheduled. 

## 2011-11-08 NOTE — Telephone Encounter (Signed)
Pt called to report that the Tramadol 50mg  tablets is not helping with her fibromyalgia anymore and would like to know what else can she take. Pt also notes that this med has work in the past but has recently stop and thinks it may be due to the recent stress that she has been under. .Please advise

## 2011-11-08 NOTE — Telephone Encounter (Signed)
If pt is that stressed out will need OV to discuss options.

## 2011-11-22 ENCOUNTER — Other Ambulatory Visit: Payer: Self-pay | Admitting: *Deleted

## 2011-11-22 ENCOUNTER — Encounter: Payer: Self-pay | Admitting: Family Medicine

## 2011-11-22 ENCOUNTER — Ambulatory Visit (INDEPENDENT_AMBULATORY_CARE_PROVIDER_SITE_OTHER): Payer: 59 | Admitting: Family Medicine

## 2011-11-22 VITALS — BP 134/92 | HR 100 | Temp 99.0°F | Ht 62.25 in | Wt 174.0 lb

## 2011-11-22 DIAGNOSIS — F411 Generalized anxiety disorder: Secondary | ICD-10-CM

## 2011-11-22 DIAGNOSIS — M722 Plantar fascial fibromatosis: Secondary | ICD-10-CM | POA: Insufficient documentation

## 2011-11-22 DIAGNOSIS — M797 Fibromyalgia: Secondary | ICD-10-CM

## 2011-11-22 DIAGNOSIS — IMO0001 Reserved for inherently not codable concepts without codable children: Secondary | ICD-10-CM

## 2011-11-22 DIAGNOSIS — F419 Anxiety disorder, unspecified: Secondary | ICD-10-CM

## 2011-11-22 MED ORDER — BUPROPION HCL ER (XL) 150 MG PO TB24: 150.0000 mg | ORAL_TABLET | Freq: Every day | ORAL | Status: DC

## 2011-11-22 MED ORDER — NAPROXEN 500 MG PO TABS: 500.0000 mg | ORAL_TABLET | Freq: Two times a day (BID) | ORAL | Status: AC

## 2011-11-22 NOTE — Progress Notes (Signed)
  Subjective:    Patient ID: Jenna Gray, female    DOB: [birth year], 31 y.o.   MRN: [id number]  HPI 'i've had a lot going on lately'- having diffuse body pain, 'i need to wake up an hour early just to get going'.  Having pain when she takes the first step in the AM.  Having long and heavy periods w/ increased cramping- on Ortho-Tri-Cyclen.  Admits to stress w/ health and money issues.  Sleeping 'pretty good' but it's fragmented due to working 3rd shift and having little one at home.  Has hx of fibromyalgia, previously on Savella/Cymbalta w/out relief.  Reports only thing that helps is the Tramadol.  Denies depressive sxs- 'i'm just more stressed'.   Review of Systems For ROS see HPI     Objective:   Physical Exam  Vitals reviewed. Constitutional: She is oriented to person, place, and time. She appears well-developed and well-nourished. No distress.  Musculoskeletal: Normal range of motion. She exhibits tenderness (over insertion of plantar fascia bilaterally). She exhibits no edema.       + TTP over multiple trigger points  Neurological: She is alert and oriented to person, place, and time. No cranial nerve deficit. Coordination normal.  Skin: Skin is warm and dry.  Psychiatric: Her behavior is normal. Thought content normal.       Somewhat flat affect          Assessment & Plan:

## 2011-11-22 NOTE — Assessment & Plan Note (Signed)
New.  Start scheduled NSAIDs, ice, cushioned insoles.  Reviewed stretches.  Reviewed supportive care and red flags that should prompt return.  Pt expressed understanding and is in agreement w/ plan.

## 2011-11-22 NOTE — Assessment & Plan Note (Signed)
New to provider- chronic for pt.  Recently deteriorated.  Likely due to recent anxiety.  Pt reports only relief is Ultram.  Will tx anxiety and see if sxs improve.

## 2011-11-22 NOTE — Patient Instructions (Signed)
Follow up in 1 month to recheck anxiety/mood Start the Wellbutrin daily Take the Naproxen twice daily x10 days and then as needed.  Take w/ food Roll your foot over a frozen water bottle twice daily Get cushioned insoles for your shoes Call with any questions or concerns Hang in there!!!

## 2011-11-22 NOTE — Assessment & Plan Note (Signed)
New.  Likely the stress and subsequent muscle tension is causing worsening fibro flare.  Start wellbutrin.  Follow closely.  Encouraged pt to seek counseling.  Will follow.

## 2011-11-22 NOTE — Telephone Encounter (Signed)
Pt leaving office visit today advised that she has been taking her tramadol more lately and will need another refill but not sure if the insurance will pay for it, stated that based on her current pain/situation/sxs she is taking one pill 5-6 times a day, please advise

## 2011-11-23 NOTE — Telephone Encounter (Signed)
Pt returned your call. [phone]

## 2011-11-23 NOTE — Telephone Encounter (Signed)
Will need to change script to 1-2 tabs every 8 hrs as needed for pain.  #90, no refills

## 2011-11-23 NOTE — Telephone Encounter (Signed)
.  left message to have patient return my call to discuss [facility] instructions per tramadol refill

## 2011-11-24 MED ORDER — TRAMADOL HCL 50 MG PO TABS: ORAL_TABLET | ORAL | Status: DC

## 2011-11-24 NOTE — Telephone Encounter (Signed)
Spoke to pt to advise medication change per [facility]:  1-2 tabs every 8 hrs as needed for pain (Tramadol 50mg )  Pt understood and sent to verified pharmacy via escribe

## 2011-11-24 NOTE — Telephone Encounter (Signed)
.  left message to have patient return my call.  

## 2011-12-02 ENCOUNTER — Other Ambulatory Visit: Payer: Self-pay | Admitting: Family Medicine

## 2011-12-02 NOTE — Telephone Encounter (Signed)
Ok for #30, 1 refill 

## 2011-12-02 NOTE — Telephone Encounter (Signed)
Last OV 11-22-11 last refill 10-27-11 #30 with no refills

## 2011-12-03 MED ORDER — CLONAZEPAM 0.5 MG PO TABS: 0.5000 mg | ORAL_TABLET | Freq: Every evening | ORAL | Status: DC | PRN

## 2011-12-03 NOTE — Telephone Encounter (Signed)
.  rx faxed to pharmacy, manually.  

## 2011-12-07 ENCOUNTER — Other Ambulatory Visit: Payer: Self-pay | Admitting: Family Medicine

## 2011-12-07 NOTE — Telephone Encounter (Signed)
called pharmacy and patient has already picked up the Rx that was faxed on 11/24/11, she is requesting a refill. Please advise ---- Jenna Gray patient   KP

## 2011-12-07 NOTE — Telephone Encounter (Signed)
no

## 2011-12-07 NOTE — Telephone Encounter (Signed)
Discuss with patient. Pt states that she has a few more days of pills left but she is on day 14 and has not been without this med in years. Pt would like to know what she needs to do because she will be out of med..Please advise     Pt aware out of office.

## 2011-12-07 NOTE — Telephone Encounter (Signed)
refill for tramadol 50mg  tablets Qty 120 Last refill 4.29.13 Take 1-tablet by mouth every 6-hours as needed for pain  Last ov 5.13.13  NOTE-we show last filled 5.15.13, qty 90, instructions take 1-2 tablets every 8-hours as needed for pain

## 2011-12-08 MED ORDER — TRAMADOL HCL 50 MG PO TABS: ORAL_TABLET | ORAL | Status: DC

## 2011-12-08 NOTE — Telephone Encounter (Signed)
Ok for #120, 3 refills 

## 2011-12-08 NOTE — Telephone Encounter (Signed)
Discuss with patient, Rx sent. 

## 2011-12-23 ENCOUNTER — Ambulatory Visit: Payer: 59 | Admitting: Family Medicine

## 2011-12-23 DIAGNOSIS — Z0289 Encounter for other administrative examinations: Secondary | ICD-10-CM

## 2012-01-25 ENCOUNTER — Ambulatory Visit (INDEPENDENT_AMBULATORY_CARE_PROVIDER_SITE_OTHER): Payer: 59 | Admitting: Family Medicine

## 2012-01-25 ENCOUNTER — Encounter: Payer: Self-pay | Admitting: Family Medicine

## 2012-01-25 VITALS — BP 135/76 | HR 77 | Temp 98.4°F | Ht 62.75 in | Wt 176.2 lb

## 2012-01-25 DIAGNOSIS — F411 Generalized anxiety disorder: Secondary | ICD-10-CM

## 2012-01-25 DIAGNOSIS — F419 Anxiety disorder, unspecified: Secondary | ICD-10-CM

## 2012-01-25 NOTE — Assessment & Plan Note (Signed)
Improved.  Continue current dose of Wellbutrin.  Will continue to follow.

## 2012-01-25 NOTE — Progress Notes (Signed)
  Subjective:    Patient ID: Jenna Gray, female    DOB: [birth year], 31 y.o.   MRN: [id number]  HPI Anxiety- sxs have improved since starting Wellbutrin at last visit.  Fibro flare has calmed.  Less tearful.  Sleeping better.  'i'm doing fine'.   Review of Systems For ROS see HPI     Objective:   Physical Exam  Vitals reviewed. Constitutional: She appears well-developed and well-nourished. No distress.  Psychiatric: She has a normal mood and affect. Her behavior is normal. Judgment and thought content normal.          Assessment & Plan:

## 2012-01-25 NOTE — Patient Instructions (Addendum)
Schedule your complete physical for Feb- see me sooner if needed! You look great!!  Keep up the good work!!! Continue the Wellbutrin!  I'm so glad it's working! Happy early birthday!!!

## 2012-02-11 ENCOUNTER — Telehealth: Payer: Self-pay | Admitting: Family Medicine

## 2012-02-11 MED ORDER — TRAMADOL HCL 50 MG PO TABS: ORAL_TABLET | ORAL | Status: DC

## 2012-02-11 NOTE — Telephone Encounter (Signed)
Refill: tramadol 50mg  tabs. Take 1 to 2 tablets by mouth every 8 hours as needed for pain. Qty 120. Last fill 01-25-12

## 2012-02-11 NOTE — Telephone Encounter (Signed)
rx sent to pharmacy by e-script for #120 with 3 refills

## 2012-02-11 NOTE — Telephone Encounter (Signed)
Last OV 01-25-12 last refill 12-08-11 #120 with 3 refills directions: Take 1-2 tabs every 8 hrs as needed for pain

## 2012-02-11 NOTE — Telephone Encounter (Signed)
Oops- #120, 3 refills

## 2012-02-11 NOTE — Telephone Encounter (Signed)
Ok for #180 w/ 1 refill

## 2012-02-22 ENCOUNTER — Telehealth: Payer: Self-pay | Admitting: Family Medicine

## 2012-02-22 MED ORDER — RANITIDINE HCL 150 MG PO TABS: 150.0000 mg | ORAL_TABLET | Freq: Two times a day (BID) | ORAL | Status: DC

## 2012-02-22 NOTE — Telephone Encounter (Signed)
rx sent to pharmacy by e-script  

## 2012-02-22 NOTE — Telephone Encounter (Signed)
Refill: Ranitidine 150mg  tablet. Take 1 tablet by mouth 2 times daily. Qty 60. Last fill 01-26-12

## 2012-02-23 ENCOUNTER — Telehealth: Payer: Self-pay | Admitting: Family Medicine

## 2012-02-23 MED ORDER — CLONAZEPAM 0.5 MG PO TABS: 0.5000 mg | ORAL_TABLET | Freq: Every evening | ORAL | Status: DC | PRN

## 2012-02-23 NOTE — Telephone Encounter (Signed)
Ok for #30, 3 refills 

## 2012-02-23 NOTE — Telephone Encounter (Signed)
Refill: Clonazepam 0.5mg  tablet. Take 1 tablet by mouth every night at bedtime as needed for sleep. Qty 30. Last fill 10-27-11

## 2012-02-23 NOTE — Telephone Encounter (Signed)
.  rx faxed to pharmacy, manually.  

## 2012-02-23 NOTE — Telephone Encounter (Signed)
Last OV 01-25-12 last refill 12-03-11 #30 with 1 refill

## 2012-02-28 ENCOUNTER — Other Ambulatory Visit: Payer: Self-pay | Admitting: *Deleted

## 2012-02-28 MED ORDER — TRAMADOL HCL 50 MG PO TABS: ORAL_TABLET | ORAL | Status: DC

## 2012-02-28 NOTE — Progress Notes (Signed)
Called cancel Rx sent in on 02-11-12 for #120 3. New Rx give for #180 no refill and per Dr Beverely Low Rx can be refilled on 03-02-12 no sooner. Pharmacy informed and advised that Pt has a history of filling med early on several occasion, Dr Beverely Low verbally made aware of situation.

## 2012-03-02 ENCOUNTER — Other Ambulatory Visit: Payer: Self-pay | Admitting: *Deleted

## 2012-03-02 NOTE — Telephone Encounter (Signed)
Received incoming fax from [facility] to refill pt tramadol and noted last refill #120 with 3 refills on 02-11-12 and rep Htet that advised to disregard the request per noted the pt picked up one of her refills today, MD Tabori made aware verbally

## 2012-03-21 ENCOUNTER — Emergency Department (HOSPITAL_BASED_OUTPATIENT_CLINIC_OR_DEPARTMENT_OTHER)
Admission: EM | Admit: 2012-03-21 | Discharge: 2012-03-21 | Disposition: A | Discharge status: Home / Self Care | Payer: 59 | Attending: Emergency Medicine | Admitting: Emergency Medicine

## 2012-03-21 ENCOUNTER — Encounter (HOSPITAL_BASED_OUTPATIENT_CLINIC_OR_DEPARTMENT_OTHER): Payer: Self-pay | Admitting: Student

## 2012-03-21 ENCOUNTER — Telehealth: Payer: Self-pay | Admitting: Family Medicine

## 2012-03-21 DIAGNOSIS — D649 Anemia, unspecified: Secondary | ICD-10-CM | POA: Insufficient documentation

## 2012-03-21 DIAGNOSIS — IMO0001 Reserved for inherently not codable concepts without codable children: Secondary | ICD-10-CM | POA: Insufficient documentation

## 2012-03-21 DIAGNOSIS — M26609 Unspecified temporomandibular joint disorder, unspecified side: Secondary | ICD-10-CM

## 2012-03-21 DIAGNOSIS — M26629 Arthralgia of temporomandibular joint, unspecified side: Secondary | ICD-10-CM | POA: Insufficient documentation

## 2012-03-21 DIAGNOSIS — G8929 Other chronic pain: Secondary | ICD-10-CM

## 2012-03-21 MED ORDER — OXYCODONE-ACETAMINOPHEN 5-325 MG PO TABS: 2.0000 | ORAL_TABLET | ORAL | Status: AC | PRN

## 2012-03-21 MED ORDER — PREDNISONE 10 MG PO TABS: 20.0000 mg | ORAL_TABLET | Freq: Every day | ORAL | Status: DC

## 2012-03-21 NOTE — ED Notes (Signed)
Pt in with c/o right upper and lower jaw pain with radiation of pain (headache) to right side of head. Reports onset 9/9 and prior HX of TMJ. Reports clicking and popping with eating and yawning.

## 2012-03-21 NOTE — Telephone Encounter (Signed)
Pt currently listed in ED, last OV 01-25-12 last refill 02-28-12 #180 no refills

## 2012-03-21 NOTE — ED Provider Notes (Signed)
History     CSN: [id number]  Arrival date & time 03/21/12  0745   First MD Initiated Contact with Patient 03/21/12 [phone]      Chief Complaint  Patient presents with  . Jaw Pain  . Headache    (Consider location/radiation/quality/duration/timing/severity/associated sxs/prior treatment) Patient is a 31 y.o. female presenting with headaches. The history is provided by the patient.  Headache    patient here with right-sided jaw pain which began after chewing last evening. History of TMJ. Has used her Naprosyn without relief. Pain is characterized as sharp and better when she does not chew. No other treatment treatment done prior to arrival  Past Medical History  Diagnosis Date  . History of ovarian cyst 02/2000    DERMOID, LEFT  . Migraine   . TMJ (temporomandibular joint syndrome)   . Fibromyalgia   . Anemia   . Right ureteral stone     Past Surgical History  Procedure Date  . Cystectomy     OVARIAN DERMOID  . Appendectomy     AGE 50  . Tubal ligation 2010    Family History  Problem Relation Age of Onset  . Hypertension Father   . Alcohol abuse Father   . Breast cancer Maternal Aunt   . Cancer Paternal Grandmother     COLON , PANCREATIC  . Hyperlipidemia Mother     History  Substance Use Topics  . Smoking status: Never Smoker   . Smokeless tobacco: Never Used  . Alcohol Use: No    OB History    Grav Para Term Preterm Abortions TAB SAB Ect Mult Living   4 4        4       Review of Systems  Neurological: Positive for headaches.  All other systems reviewed and are negative.    Allergies  Penicillins  Home Medications   Current Outpatient Rx  Name Route Sig Dispense Refill  . BUPROPION HCL ER (XL) 150 MG PO TB24 Oral Take 1 tablet (150 mg total) by mouth daily. 30 tablet 3  . CLONAZEPAM 0.5 MG PO TABS Oral Take 1 tablet (0.5 mg total) by mouth at bedtime as needed. For sleep 30 tablet 3  . NAPROXEN 500 MG PO TABS Oral Take 1 tablet (500 mg total)  by mouth 2 (two) times daily with a meal. 60 tablet 0  . ORTHO TRI-CYCLEN (28) PO Oral Take 1 tablet by mouth every evening.     Marland Kitchen RANITIDINE HCL 150 MG PO TABS Oral Take 1 tablet (150 mg total) by mouth 2 (two) times daily. 180 tablet 1  . TRAMADOL HCL 50 MG PO TABS  Take 1-2 tabs every 8 hrs as needed for pain 180 tablet 0    BP 132/84  Pulse 101  Temp 98.7 F (37.1 C) (Oral)  Resp 20  Ht 5\' 1"  (1.549 m)  Wt 170 lb (77.111 kg)  BMI 32.12 kg/m2  SpO2 100%  LMP 02/29/2012  Physical Exam  Nursing note and vitals reviewed. Constitutional: She is oriented to person, place, and time. She appears well-developed and well-nourished.  Non-toxic appearance.  HENT:  Head: Normocephalic and atraumatic.    Eyes: Conjunctivae are normal. Pupils are equal, round, and reactive to light.  Neck: Normal range of motion.  Cardiovascular: Normal rate.   Pulmonary/Chest: Effort normal.  Neurological: She is alert and oriented to person, place, and time.  Skin: Skin is warm and dry.  Psychiatric: She has a normal mood and affect.  ED Course  Procedures (including critical care time)  Labs Reviewed - No data to display No results found.   No diagnosis found.    MDM  We will treat patient's TMJ pain with short course of prednisone and Percocet        [name], MD 03/21/12 [phone]

## 2012-03-21 NOTE — Telephone Encounter (Signed)
Refill: Tramadol 50mg  tablets. Take 1 to 2 tablets by mouth every 8 hours as needed for pain. Qty 180. Last fill 03-02-12

## 2012-03-21 NOTE — Telephone Encounter (Signed)
Last fill was 8/22- this is 12 days too early for refill.  If in this much pain will need pain management referral

## 2012-03-22 NOTE — Telephone Encounter (Signed)
Pt states that she has been taking 2 tab four times a day or every 6 hours. I advise Pt med is suppose to be taking 1-2 tab every 8 hours as needed and per Dr Beverely Low if she is in that much pain she will need to be referred to pain management. Pt states that she is in that much pain and would like to know what she needs to do. .Please advise

## 2012-03-22 NOTE — Telephone Encounter (Signed)
Refill denied per MD Beverely Low notations

## 2012-03-22 NOTE — Telephone Encounter (Signed)
Please refer to pain management for ongoing tx of chronic pain.  Will provide 1 refill of meds to allow time for appt.  Should limit med to every 8 hrs

## 2012-03-23 MED ORDER — TRAMADOL HCL 50 MG PO TABS: ORAL_TABLET | ORAL | Status: DC

## 2012-03-23 NOTE — Telephone Encounter (Signed)
.  left message to have patient return my call. Rx sent to pharmacy,referral for pain management placed in chart, pt needs to be made aware of referral and RX sent

## 2012-03-23 NOTE — Addendum Note (Signed)
Addended by: Derry Lory A on: 03/23/2012 04:27 PM   Modules accepted: Orders

## 2012-03-23 NOTE — Telephone Encounter (Signed)
Pt called triage line lm at 12:49p, Needs information about tramadol & pain MGMT she spoke with kim about yesterday  Cb# [phone]

## 2012-03-24 ENCOUNTER — Telehealth: Payer: Self-pay | Admitting: Family Medicine

## 2012-03-24 NOTE — Telephone Encounter (Signed)
Opened in error. BC °

## 2012-03-24 NOTE — Telephone Encounter (Signed)
Pt returned your call. I informed her that her rx was at the pharmacy and that we put in for referral to pain mgmt.

## 2012-03-30 ENCOUNTER — Encounter: Payer: Self-pay | Admitting: Physical Medicine & Rehabilitation

## 2012-04-14 ENCOUNTER — Encounter: Payer: 59 | Attending: Physical Medicine & Rehabilitation | Admitting: Physical Medicine & Rehabilitation

## 2012-04-14 ENCOUNTER — Encounter: Payer: Self-pay | Admitting: Physical Medicine & Rehabilitation

## 2012-04-14 VITALS — BP 143/81 | HR 96 | Resp 16 | Ht 61.0 in | Wt 175.0 lb

## 2012-04-14 DIAGNOSIS — E559 Vitamin D deficiency, unspecified: Secondary | ICD-10-CM

## 2012-04-14 DIAGNOSIS — D509 Iron deficiency anemia, unspecified: Secondary | ICD-10-CM

## 2012-04-14 DIAGNOSIS — IMO0001 Reserved for inherently not codable concepts without codable children: Secondary | ICD-10-CM | POA: Insufficient documentation

## 2012-04-14 DIAGNOSIS — M7918 Myalgia, other site: Secondary | ICD-10-CM

## 2012-04-14 DIAGNOSIS — M722 Plantar fascial fibromatosis: Secondary | ICD-10-CM

## 2012-04-14 DIAGNOSIS — M797 Fibromyalgia: Secondary | ICD-10-CM

## 2012-04-14 DIAGNOSIS — G478 Other sleep disorders: Secondary | ICD-10-CM | POA: Insufficient documentation

## 2012-04-14 DIAGNOSIS — F411 Generalized anxiety disorder: Secondary | ICD-10-CM | POA: Insufficient documentation

## 2012-04-14 DIAGNOSIS — G4700 Insomnia, unspecified: Secondary | ICD-10-CM

## 2012-04-14 DIAGNOSIS — R52 Pain, unspecified: Secondary | ICD-10-CM | POA: Insufficient documentation

## 2012-04-14 LAB — CBC
Platelets: 297 10*3/uL (ref 150–400)
RBC: 4.39 MIL/uL (ref 3.87–5.11)
WBC: 10.2 10*3/uL (ref 4.0–10.5)

## 2012-04-14 MED ORDER — TRAZODONE HCL 50 MG PO TABS: 25.0000 mg | ORAL_TABLET | Freq: Every day | ORAL | Status: DC

## 2012-04-14 MED ORDER — TRAMADOL HCL 50 MG PO TABS: ORAL_TABLET | ORAL | Status: DC

## 2012-04-14 NOTE — Progress Notes (Signed)
Subjective:    Patient ID: Jenna Gray, female    DOB: 30-Oct-1980, 31 y.o.   MRN: [id number]    CC: generalized pain HPI This is a pleasant 31 yo WF referred here by Dr. Beverely Gray, who was diagnosed with fibromyalgia about 7 years ago. She first realized something was wrong when she began to have pain and fatigue out of proportion to the level she might have expected. She was diagnosed by an urgent care md and eventually was treated by Dr. Donovan Gray. Most recently, Dr. Neena Gray has managed her pain.   Typically, she has generalized pain, ranging from her face to her feet. Often pain is worse on the left. She complains of headaches, too. Pain is worst when she wakes up and then when she is fatigued at the end of the day.   From a treatment standpoint she uses heat, exercise including walking. She feels that the walking is very beneficial. Tramadol has helped, and she continues on it currently. She typically takes 6-8 pills (300-400mg  daily) on a q6hr basis. She has had more stress and a job change recently, and her pain has increased. She has tried cymbalta, Conservation officer, historic buildings, neurontin which all either didn't work or had side effects. She also tried elavil which caused headache. She was started on wellbutrin 6 months ago for anxiety and pain. Klonopin was started at about the same time to help with sleep.   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 She has had difficulties with sleep as a result. Typically she sleeps for a few hours at a time during the day around her kids' schedules. She has 4 children from 31 to 63 years of age. Their father lives with them and helps to a certain extent.    Pain Inventory Average Pain 6 Pain Right Now 7 My pain is aching  In the last 24 hours, has pain interfered with the following? General activity 6 Relation with others 5 Enjoyment of life 6 What TIME of day is your pain at its worst? All Day Sleep (in general)  Fair  Pain is worse with: unsure Pain improves with: heat/ice and medication Relief from Meds: 6  Mobility walk without assistance ability to climb steps?  yes do you drive?  yes  Function employed # of hrs/week 40  Neuro/Psych No problems in this area  Prior Studies None listed  Physicians involved in your care None listed   Family History  Problem Relation Age of Onset  . Hypertension Father   . Alcohol abuse Father   . Breast cancer Maternal Aunt   . Cancer Paternal Grandmother     COLON , PANCREATIC  . Hyperlipidemia Mother    History   Social History  . Marital Status: Single    Spouse Name: N/A    Number of Children: N/A  . Years of Education: N/A   Social History Main Topics  . Smoking status: Never Smoker   . Smokeless tobacco: Never Used  . Alcohol Use: No  . Drug Use: No  . Sexually Active: Yes -- Female partner(s)    Birth Control/ Protection: Surgical   Other Topics Concern  . None   Social History Narrative  . None   Past Surgical History  Procedure Date  . Cystectomy     OVARIAN DERMOID  . Appendectomy     AGE 64  . Tubal ligation 2010   Past Medical History  Diagnosis Date  .  History of ovarian cyst 02/2000    DERMOID, LEFT  . Migraine   . TMJ (temporomandibular joint syndrome)   . Fibromyalgia   . Anemia   . Right ureteral stone    LMP 02/29/2012     Review of Systems  Constitutional: Negative.   HENT: Negative.   Eyes: Negative.   Respiratory: Negative.   Cardiovascular: Negative.   Gastrointestinal: Negative.   Genitourinary: Negative.   Musculoskeletal: Positive for myalgias and arthralgias.  Skin: Negative.   Neurological: Negative.   Hematological: Negative.   Psychiatric/Behavioral:       Anxiety       Objective:   Physical Exam  General: Alert and oriented x 3, No apparent distress, mildly overweight HEENT: Head is normocephalic, atraumatic, PERRLA, EOMI, sclera anicteric, oral mucosa pink and  moist, dentition intact, ext ear canals clear,  Neck: Supple without JVD or lymphadenopathy Heart: Reg rate and rhythm. No murmurs rubs or gallops Chest: CTA bilaterally without wheezes, rales, or rhonchi; no distress Abdomen: Soft, non-tender, non-distended, bowel sounds positive. Extremities: No clubbing, cyanosis, or edema. Pulses are 2+ Skin: Clean and intact without signs of breakdown Neuro: Pt is cognitively appropriate with normal insight, memory, and awareness. Cranial nerves 2-12 are intact. Sensory exam is normal. Reflexes are 2+ in all 4's. Fine motor coordination is intact. No tremors. Motor function is grossly 5/5. Normal gait Musculoskeletal: She has pain in multiple areas including the subocciput, traps, shoulders, chest, elbows, Gray back, greater trochs, medial condyles of the femur. She has full ROM. Bilateral traps are tight and painful with palpation. She has a head forward, shoulders rounded posture. She has mild pain with head ROM and movement in the lumbar spine, but no, one position increased her pain substantially.  Psych: Pt's affect is appropriate. Pt is cooperative         Assessment & Plan:  1. Fibromyalgia syndrome 2. Myofascial pain syndrome 3. 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 4. Iron deficiency anemia (?menstral related)  Plan: 1. Lab work was collected today: to include Thyroid panel, CBC, Mg++, Vitamin D levels 2. Recommended supplements: DHEA 6m daily and Coenzyme Q10 for stamina and energy 3. Discussed appropriate posture and technique at work. Pilates principles were provided as a starting point. PT would be ideal, but I'm not sure that would be something she could fit in her schedule at the moment.  4. Will likely need to resume an iron supplement as her anemia will exacerbate her FMS symptoms.  5. Continue with aerobic exercise to tolerance. Fortunately Jenna Gray understands the importance of exercise in managing fibromyalgia. 6. Will  try trazodone 25-50mg  for sleep. I discouraged the use of antihistamines and the klonopin has not been overly effective to this point (nor does it encourage REM sleep). Ultimately, she will need to find a more appropriate sleep schedule. I think her [place] schedule and poor sleep are the biggest factor which have led to an increase in her pain levels.  7. Refilled her ultram 50-100mg  q6 hours prn #210. A UDS was collected today also 8. All questions were encouraged and answered. I'll see her back in about one month. I would like to thank Dr. Beverely Gray for allowing [place] to participate in the care of the pleasant patient.

## 2012-04-14 NOTE — Patient Instructions (Signed)
TRY DHEA 25MG  DAILY FOR ENERGY AND PAIN  TRY COENZYME Q10 DAILY FOR PAIN  REMEMBER TO USE APPROPRIATE POSTURE WHILE AT WORK

## 2012-04-15 LAB — TSH: TSH: 3.992 u[IU]/mL (ref 0.350–4.500)

## 2012-04-15 LAB — COMPREHENSIVE METABOLIC PANEL
ALT: 24 U/L (ref 0–35)
CO2: 25 mEq/L (ref 19–32)
Calcium: 9.4 mg/dL (ref 8.4–10.5)
Chloride: 102 mEq/L (ref 96–112)
Potassium: 3.9 mEq/L (ref 3.5–5.3)
Sodium: 137 mEq/L (ref 135–145)
Total Protein: 7 g/dL (ref 6.0–8.3)

## 2012-04-15 LAB — MAGNESIUM: Magnesium: 1.7 mg/dL (ref 1.5–2.5)

## 2012-04-19 LAB — VITAMIN D 1,25 DIHYDROXY
Vitamin D2 1, 25 (OH)2: <8 pg/mL
Vitamin D3 1, 25 (OH)2: 43 pg/mL

## 2012-04-21 ENCOUNTER — Telehealth: Payer: Self-pay | Admitting: Physical Medicine & Rehabilitation

## 2012-04-21 NOTE — Telephone Encounter (Signed)
i have called her about lab results once, a few days ago, and have not received a response. We need to make some changes based on her labs, but i will not do so without her consent and discussing the results. Please contact her again so that we can proceed.  thx

## 2012-04-21 NOTE — Telephone Encounter (Signed)
Lm for patient to call office about her blood work.  According to her work number she works third shift.  This could be why we cannot get a hold of her.

## 2012-05-03 ENCOUNTER — Telehealth: Payer: Self-pay

## 2012-05-03 NOTE — Telephone Encounter (Signed)
Please review lab results and advise 

## 2012-05-03 NOTE — Telephone Encounter (Signed)
Message copied by Judd Gaudier on Wed May 03, 2012 11:44 AM ------      Message from: Su Monks      Created: Wed May 03, 2012  8:49 AM       Please send to ordering physician, patient has abnormal T4 and other abnormal values, do not know whether she should follow up with her PCp, or whether ordering physician will handle follow up

## 2012-05-08 NOTE — Telephone Encounter (Signed)
Lm for patient to call office regarding her lab work.

## 2012-05-08 NOTE — Telephone Encounter (Signed)
I reviewed these several weeks ago. Would recommend iron supplement daily for anemia as well as low dose synthroid for hypothyroid state qday to start

## 2012-05-09 ENCOUNTER — Other Ambulatory Visit: Payer: Self-pay | Admitting: Physical Medicine & Rehabilitation

## 2012-05-12 ENCOUNTER — Encounter: Payer: Self-pay | Admitting: Physical Medicine & Rehabilitation

## 2012-05-12 ENCOUNTER — Encounter: Payer: 59 | Attending: Physical Medicine & Rehabilitation | Admitting: Physical Medicine & Rehabilitation

## 2012-05-12 VITALS — BP 127/84 | HR 71 | Resp 14 | Ht 61.0 in | Wt 169.0 lb

## 2012-05-12 DIAGNOSIS — E039 Hypothyroidism, unspecified: Secondary | ICD-10-CM

## 2012-05-12 DIAGNOSIS — IMO0001 Reserved for inherently not codable concepts without codable children: Secondary | ICD-10-CM | POA: Insufficient documentation

## 2012-05-12 DIAGNOSIS — F419 Anxiety disorder, unspecified: Secondary | ICD-10-CM

## 2012-05-12 DIAGNOSIS — F411 Generalized anxiety disorder: Secondary | ICD-10-CM | POA: Insufficient documentation

## 2012-05-12 DIAGNOSIS — G4700 Insomnia, unspecified: Secondary | ICD-10-CM

## 2012-05-12 DIAGNOSIS — R52 Pain, unspecified: Secondary | ICD-10-CM | POA: Insufficient documentation

## 2012-05-12 DIAGNOSIS — M797 Fibromyalgia: Secondary | ICD-10-CM

## 2012-05-12 DIAGNOSIS — D509 Iron deficiency anemia, unspecified: Secondary | ICD-10-CM

## 2012-05-12 DIAGNOSIS — G478 Other sleep disorders: Secondary | ICD-10-CM | POA: Insufficient documentation

## 2012-05-12 HISTORY — DX: Hypothyroidism, unspecified: E03.9

## 2012-05-12 MED ORDER — LEVOTHYROXINE SODIUM 25 MCG PO TABS: 25.0000 ug | ORAL_TABLET | Freq: Every day | ORAL | Status: DC

## 2012-05-12 NOTE — Progress Notes (Signed)
Subjective:    Patient ID: Jenna Gray, female    DOB: Oct 24, 1980, 31 y.o.   MRN: [id number]  HPI Jenna Gray is back regarding her chronic pain, FMS. The trazodone was helpful for sleep, although she complained of a little congestion when she took the med which seems to have lessened recently. She resume an Fe supplement for her anemia and has been experimenting with different formulations to find one that is most easy to "digest". We had contacted her about her thyroid testing, but she ultimately did not respond. Her T4 level was slightly low.   She continjues to manage her family and work night shift which is quite tiring for her.  She uses ultram for pain up to 6 per day  Pain Inventory Average Pain 6 Pain Right Now 6 My pain is aching  In the last 24 hours, has pain interfered with the following? General activity 4 Relation with others 1 Enjoyment of life 3 What TIME of day is your pain at its worst? varies Sleep (in general) Fair  Pain is worse with: unsure Pain improves with: heat/ice and medication Relief from Meds: 8  Mobility walk without assistance how many minutes can you walk? 20 ability to climb steps?  yes do you drive?  yes Do you have any goals in this area?  no  Function employed # of hrs/week 40 [place] Do you have any goals in this area?  no  Neuro/Psych No problems in this area  Prior Studies Any changes since last visit?  no  Physicians involved in your care Any changes since last visit?  no   Family History  Problem Relation Age of Onset  . Hypertension Father   . Alcohol abuse Father   . Breast cancer Maternal Aunt   . Cancer Paternal Grandmother     COLON , PANCREATIC  . Hyperlipidemia Mother    History   Social History  . Marital Status: Single    Spouse Name: N/A    Number of Children: N/A  . Years of Education: N/A   Social History Main Topics  . Smoking status: Never Smoker   . Smokeless tobacco: Never Used  . Alcohol  Use: No  . Drug Use: No  . Sexually Active: Yes -- Female partner(s)    Birth Control/ Protection: Surgical   Other Topics Concern  . None   Social History Narrative  . None   Past Surgical History  Procedure Date  . Cystectomy     OVARIAN DERMOID  . Appendectomy     AGE 77  . Tubal ligation 2010   Past Medical History  Diagnosis Date  . History of ovarian cyst 02/2000    DERMOID, LEFT  . Migraine   . TMJ (temporomandibular joint syndrome)   . Fibromyalgia   . Anemia   . Right ureteral stone    BP 127/84  Pulse 71  Resp 14  Ht 5\' 1"  (1.549 m)  Wt 169 lb (76.658 kg)  BMI 31.93 kg/m2  SpO2 100%     Review of Systems  Musculoskeletal: Positive for myalgias and arthralgias.  All other systems reviewed and are negative.       Objective:   Physical Exam General: Alert and oriented x 3, No apparent distress, mildly overweight  HEENT: Head is normocephalic, atraumatic, PERRLA, EOMI, sclera anicteric, oral mucosa pink and moist, dentition intact, ext ear canals clear,  Neck: Supple without JVD or lymphadenopathy  Heart: Reg rate and rhythm. No murmurs  rubs or gallops  Chest: CTA bilaterally without wheezes, rales, or rhonchi; no distress  Abdomen: Soft, non-tender, non-distended, bowel sounds positive.  Extremities: No clubbing, cyanosis, or edema. Pulses are 2+  Skin: Clean and intact without signs of breakdown  Neuro: Pt is cognitively appropriate with normal insight, memory, and awareness. Cranial nerves 2-12 are intact. Sensory exam is normal. Reflexes are 2+ in all 4's. Fine motor coordination is intact. No tremors. Motor function is grossly 5/5. Normal gait  Musculoskeletal: She has pain in multiple areas including the subocciput, traps, shoulders, chest, elbows, low back, greater trochs, medial condyles of the femur. She has full ROM. Bilateral traps are tight and painful with palpation. She has a head forward, shoulders rounded posture. She has mild pain with  head ROM and movement in the lumbar spine, but no, one position increased her pain substantially.  Psych: Pt's affect is appropriate. Pt is cooperative   Assessment & Plan:   1. Fibromyalgia syndrome  2. Myofascial pain syndrome  3. 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  4. Iron deficiency anemia (?menstral related)  5. Mild hypothyroidism  Plan:  1. Recommend low dose synthroid 0.25mg  daily. Will recheck TFT's in 2 months 2. Recommended supplements: She will continue with DHEA 28m daily and potentially try Coenzyme Q10 for stamina and energy in amonth or two depending on how she responds to the DHEA. 3. Continue appropriate posture and technique at work. Pilates principles were provided as a starting point. PT would be ideal, but I'm not sure that would be something she could fit in her schedule at the moment.  4. Continue iron supplementation. Will recheck hgb and order iron studies in 2 months. Recommend that she follow up with her pcp or gyn regarding mgt.  5. Continue with aerobic exercise to tolerance. Fortunately Jenna Gray understands the importance of exercise in managing fibromyalgia.  6. Will maintain trazodone 25-50mg  for sleep. Also discussed the use of melatonin which could be anoption as well if she finds the trazodone ultimately intolerable. 7. She has refills on ultram 50-100mg  q6 hours prn #210.    8. All questions were encouraged and answered. I'll see her back in about one month. 30 minutes of face to face patient care time were spent during this visit. All questions were encouraged and answered.

## 2012-05-12 NOTE — Patient Instructions (Signed)
MELATONIN---IF TRAZODONE DOESN'T WORK OUT----YOU CAN TAKE 3-10MG  AT BEDTIME.

## 2012-05-31 ENCOUNTER — Telehealth: Payer: Self-pay

## 2012-05-31 MED ORDER — TRAMADOL HCL 50 MG PO TABS: 100.0000 mg | ORAL_TABLET | Freq: Three times a day (TID) | ORAL | Status: DC | PRN

## 2012-05-31 NOTE — Telephone Encounter (Signed)
Patient having trouble getting tramadol filled.  The original script was written for a quantity for 35 days it needs to be changed to 30 days.  Please call pharmacy [facility] on [address].  Tramadol resent to [facility].

## 2012-06-01 ENCOUNTER — Telehealth: Payer: Self-pay | Admitting: *Deleted

## 2012-06-01 NOTE — Telephone Encounter (Signed)
Would like a call. Patient was advised to try to take only 6 of the Tramadol every day. Patient states her pain is so severe she is having to take 2 tabs q 6 hours which is 8 tablets a day. She is running low on her Rx and would like to know what she needs to do? 

## 2012-06-01 NOTE — Telephone Encounter (Signed)
Would like a call. Patient was advised to try to take only 6 of the Tramadol every day. Patient states her pain is so severe she is having to take 2 tabs q 6 hours which is 8 tablets a day. She is running low on her Rx and would like to know what she needs to do?

## 2012-06-02 ENCOUNTER — Telehealth: Payer: Self-pay | Admitting: *Deleted

## 2012-06-02 ENCOUNTER — Encounter: Payer: 59 | Attending: Physical Medicine and Rehabilitation | Admitting: Physical Medicine and Rehabilitation

## 2012-06-02 ENCOUNTER — Encounter: Payer: Self-pay | Admitting: Physical Medicine and Rehabilitation

## 2012-06-02 VITALS — BP 136/82 | HR 95 | Resp 14 | Ht 61.0 in | Wt 167.4 lb

## 2012-06-02 DIAGNOSIS — F411 Generalized anxiety disorder: Secondary | ICD-10-CM | POA: Insufficient documentation

## 2012-06-02 DIAGNOSIS — M797 Fibromyalgia: Secondary | ICD-10-CM

## 2012-06-02 DIAGNOSIS — D509 Iron deficiency anemia, unspecified: Secondary | ICD-10-CM | POA: Insufficient documentation

## 2012-06-02 DIAGNOSIS — IMO0001 Reserved for inherently not codable concepts without codable children: Secondary | ICD-10-CM | POA: Insufficient documentation

## 2012-06-02 DIAGNOSIS — E039 Hypothyroidism, unspecified: Secondary | ICD-10-CM | POA: Insufficient documentation

## 2012-06-02 MED ORDER — METHOCARBAMOL 500 MG PO TABS: 500.0000 mg | ORAL_TABLET | Freq: Three times a day (TID) | ORAL | Status: DC

## 2012-06-02 NOTE — Progress Notes (Signed)
Subjective:    Patient ID: Jenna Gray, female    DOB: 30-Jul-1980, 31 y.o.   MRN: [id number]  HPI The patient complains about chronic pain all over her body, from her Fibromyalgia.  The problem has gotten a little worse.she complains about muscle pain and tightness. Pain Inventory Average Pain 8 Pain Right Now 8 My pain is aching  In the last 24 hours, has pain interfered with the following? General activity 5 Relation with others 6 Enjoyment of life 7 What TIME of day is your pain at its worst? all of the time Sleep (in general) Fair  Pain is worse with: some activites Pain improves with: heat/ice and medication Relief from Meds: 9  Mobility walk without assistance  Function employed # of hrs/week 40+  Neuro/Psych No problems in this area  Prior Studies Any changes since last visit?  no  Physicians involved in your care Any changes since last visit?  no   Family History  Problem Relation Age of Onset  . Hypertension Father   . Alcohol abuse Father   . Breast cancer Maternal Aunt   . Cancer Paternal Grandmother     COLON , PANCREATIC  . Hyperlipidemia Mother    History   Social History  . Marital Status: Single    Spouse Name: N/A    Number of Children: N/A  . Years of Education: N/A   Social History Main Topics  . Smoking status: Never Smoker   . Smokeless tobacco: Never Used  . Alcohol Use: No  . Drug Use: No  . Sexually Active: Yes -- Female partner(s)    Birth Control/ Protection: Surgical   Other Topics Concern  . None   Social History Narrative  . None   Past Surgical History  Procedure Date  . Cystectomy     OVARIAN DERMOID  . Appendectomy     AGE 56  . Tubal ligation 2010   Past Medical History  Diagnosis Date  . History of ovarian cyst 02/2000    DERMOID, LEFT  . Migraine   . TMJ (temporomandibular joint syndrome)   . Fibromyalgia   . Anemia   . Right ureteral stone    BP 136/82  Pulse 95  Resp 14  Ht 5\' 1"  (1.549  m)  Wt 167 lb 6.4 oz (75.932 kg)  BMI 31.63 kg/m2  SpO2 99%    Review of Systems  Musculoskeletal: Positive for myalgias.  All other systems reviewed and are negative.       Objective:   Physical Exam  Constitutional: She is oriented to person, place, and time. She appears well-developed and well-nourished.  HENT:  Head: Normocephalic.  Neck: Neck supple.  Musculoskeletal: She exhibits tenderness.  Neurological: She is alert and oriented to person, place, and time.  Skin: Skin is warm and dry.  Psychiatric: She has a normal mood and affect.  Symmetric normal motor tone is noted throughout, except increased tone in neck muscles and right paraspinal. Normal muscle bulk. Muscle testing reveals 5/5 muscle strength of the upper extremity, and 5/5 of the lower extremity. Full range of motion in upper and lower extremities. ROM of spine is not restricted. Fine motor movements are normal in both hands. Sensory is intact and symmetric to light touch, pinprick and proprioception. DTR in the upper and lower extremity are present and symmetric 2+. No clonus is noted.  Patient arises from chair without difficulty. Narrow based gait with normal arm swing bilateral .  Assessment & Plan:  1. Fibromyalgia syndrome  2. Myofascial pain syndrome  3. 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  4. Iron deficiency anemia (?menstral related)  5. Mild hypothyroidism  Plan:  1. Recommend low dose synthroid 0.25mg  daily. Will recheck TFT's in 2 months  2. Recommended supplements: She will continue with DHEA 34m daily and potentially try Coenzyme Q10 for stamina and energy in amonth or two depending on how she responds to the DHEA.  3. Continue appropriate posture and technique at work. Pilates principles were provided as a starting point. Ordered aquatic PT. 4. Continue iron supplementation. Will recheck hgb and order iron studies in 2 months. Recommend that she follow up with her pcp  or gyn regarding mgt.  5. Continue with aerobic exercise to tolerance. Fortunately Brexlee understands the importance of exercise in managing fibromyalgia.  6.Patient d/c trazodone 25-50mg , she is taking melatonin for sleep.  7. She has refills on ultram 50-100mg  q8 hours .  Prescribed Robaxin 500mg  tid prn  for her muscle pain/spasms. 8. All questions were encouraged and answered. I'll see her back in about 2 month.  UDS was inconsistent, showed Oxycodone, and extremely high Tramadol, will not prescribe narcotics to patient.

## 2012-06-02 NOTE — Telephone Encounter (Signed)
Lm advising patient to call office regarding increase in her medications.

## 2012-06-02 NOTE — Telephone Encounter (Signed)
Advised patient is not good practice to increase medication without being seen by a provider to discuss the changed.  Patient said she thought it was ok and now she is going to be out early.  Patient will schedule appointment to discuss this with a provider.

## 2012-06-02 NOTE — Telephone Encounter (Signed)
Called and spoke with her pharmacy. Patient got her Tramadol filled last on 12/8. They received the Rx that was sent in on 11/22 but put it on hold. Advised pharmacist that she can fill on 12/8 when she is due to have her medications.

## 2012-06-02 NOTE — Telephone Encounter (Signed)
Pt went home and counted her Tramadol. She only has 6 left. Will run out and read that it is not good to just stop it on her ow.

## 2012-06-02 NOTE — Patient Instructions (Signed)
Continue with your walking program, and your stretching exercises.

## 2012-06-02 NOTE — Telephone Encounter (Signed)
She should take her tablets one tablet a day, so she does not have severe withdrawal Sx

## 2012-06-02 NOTE — Telephone Encounter (Signed)
When is she due, she has to take meds as prescribed, she has to spread the rest until she can refill, please find out when she is due, there was a prescription sent to her pharm. On the 20th

## 2012-06-02 NOTE — Telephone Encounter (Signed)
Per Clydie Braun PA patient only has 6 tabs left of her Tramadol. She needs to taper off the medication. Can take one a day for 6 days. May have some withdrawal symptoms but nothing severe. Left message on patients Voicemail

## 2012-06-05 NOTE — Telephone Encounter (Signed)
Left detailed message on pt's Voicemail. Correction on Previous Refill. Patient originally got Rx on 11/8 not 12/8. Due for refill on 12/8.

## 2012-06-29 ENCOUNTER — Telehealth: Payer: Self-pay | Admitting: *Deleted

## 2012-06-29 ENCOUNTER — Encounter: Payer: Self-pay | Admitting: Internal Medicine

## 2012-06-29 ENCOUNTER — Ambulatory Visit (INDEPENDENT_AMBULATORY_CARE_PROVIDER_SITE_OTHER): Payer: 59 | Admitting: Internal Medicine

## 2012-06-29 VITALS — BP 122/76 | HR 99 | Temp 98.3°F | Ht 62.0 in | Wt 177.0 lb

## 2012-06-29 DIAGNOSIS — J019 Acute sinusitis, unspecified: Secondary | ICD-10-CM

## 2012-06-29 MED ORDER — AZITHROMYCIN 250 MG PO TABS: ORAL_TABLET | ORAL | Status: DC

## 2012-06-29 NOTE — Telephone Encounter (Signed)
Pt called & left msg on triage vmail stating that she has been seeing pain management but does not like the Doctor she has been seeing. Pt states that she is due for a refill on her pain meds but would like to know what she needs to do. Pt states she would either like for Dr. Beverely Low to take over with prescribing her pain meds or another referral to a different pain management Doctor. Please advise.

## 2012-06-29 NOTE — Progress Notes (Signed)
HPI  Pt presents to the clinic with a 3 day history of sinus pain, pressure, headache and fever. She has taken OTC sudafed with some relief. The pain keeps getting more intense every day. She also has some nasal congestion. She has had sick contacts.  Review of Systems    Past Medical History  Diagnosis Date  . History of ovarian cyst 02/2000    DERMOID, LEFT  . Migraine   . TMJ (temporomandibular joint syndrome)   . Fibromyalgia   . Anemia   . Right ureteral stone     Family History  Problem Relation Age of Onset  . Hypertension Father   . Alcohol abuse Father   . Breast cancer Maternal Aunt   . Cancer Paternal Grandmother     COLON , PANCREATIC  . Hyperlipidemia Mother     History   Social History  . Marital Status: Single    Spouse Name: N/A    Number of Children: N/A  . Years of Education: N/A   Occupational History  . Not on file.   Social History Main Topics  . Smoking status: Never Smoker   . Smokeless tobacco: Never Used  . Alcohol Use: No  . Drug Use: No  . Sexually Active: Yes -- Female partner(s)    Birth Control/ Protection: Surgical   Other Topics Concern  . Not on file   Social History Narrative  . No narrative on file    Allergies  Allergen Reactions  . Penicillins Hives and Itching  . Neurontin (Gabapentin)     headache     Constitutional: Positive headache, fatigue and fever. Denies abrupt weight changes.  HEENT:  Positive eye pain, pressure behind the eyes, facial pain, nasal congestion and sore throat. Denies eye redness, ear pain, ringing in the ears, wax buildup, runny nose or bloody nose. Respiratory: Positive cough and thick green sputum production. Denies difficulty breathing or shortness of breath.  Cardiovascular: Denies chest pain, chest tightness, palpitations or swelling in the hands or feet.   No other specific complaints in a complete review of systems (except as listed in HPI above).  Objective:    General: Appears  her stated age, well developed, well nourished in NAD. HEENT: Head: normal shape and size, sinuses tender to palpation; Eyes: sclera white, no icterus, conjunctiva pink, PERRLA and EOMs intact; Ears: Tm's gray and intact, normal light reflex; Nose: mucosa pink and moist, septum midline; Throat/Mouth: + PND. Teeth present, mucosa pink and moist, no exudate noted, no lesions or ulcerations noted.  Neck: Mild cervical lymphadenopathy. Neck supple, trachea midline. No massses, lumps or thyromegaly present.  Cardiovascular: Normal rate and rhythm. S1,S2 noted.  No murmur, rubs or gallops noted. No JVD or BLE edema. No carotid bruits noted. Pulmonary/Chest: Normal effort and positive vesicular breath sounds. No respiratory distress. No wheezes, rales or ronchi noted.      Assessment & Plan:   Acute bacterial sinusitis  Can use a Neti Pot which can be purchased from your local drug store. Flonase 2 sprays each nostril for 3 days and then as needed. Azithromax x 5 days  RTC as needed or if symptoms persist.

## 2012-06-29 NOTE — Telephone Encounter (Signed)
I do not treat chronic pain.  Pt will need to get this refill from her current pain management doctor and we can refer her to another pain clinic if that's what she desires.  I will not write her prescriptions.

## 2012-06-29 NOTE — Patient Instructions (Signed)

## 2012-06-29 NOTE — Telephone Encounter (Signed)
Spoke with the pt and informed her that Dr. Beverely Low will not be able to RF her pain med and she will have to call her pain management provider.  Informed the pt that we can referral to another pain clinic, but the pt said she will think about it.//AB/CMA

## 2012-07-06 ENCOUNTER — Ambulatory Visit: Payer: 59 | Admitting: Family Medicine

## 2012-07-18 ENCOUNTER — Encounter: Payer: 59 | Admitting: Physical Medicine & Rehabilitation

## 2012-07-25 ENCOUNTER — Ambulatory Visit (INDEPENDENT_AMBULATORY_CARE_PROVIDER_SITE_OTHER): Payer: 59 | Admitting: Family Medicine

## 2012-07-25 VITALS — BP 130/80 | HR 104 | Temp 98.8°F | Resp 16 | Ht 61.5 in | Wt 166.8 lb

## 2012-07-25 DIAGNOSIS — R29898 Other symptoms and signs involving the musculoskeletal system: Secondary | ICD-10-CM

## 2012-07-25 DIAGNOSIS — M26629 Arthralgia of temporomandibular joint, unspecified side: Secondary | ICD-10-CM

## 2012-07-25 DIAGNOSIS — R6884 Jaw pain: Secondary | ICD-10-CM

## 2012-07-25 MED ORDER — TRAMADOL HCL 50 MG PO TABS: 100.0000 mg | ORAL_TABLET | Freq: Four times a day (QID) | ORAL | Status: DC | PRN

## 2012-07-25 MED ORDER — MELOXICAM 7.5 MG PO TABS: 7.5000 mg | ORAL_TABLET | Freq: Every day | ORAL | Status: DC

## 2012-07-25 NOTE — Patient Instructions (Signed)
You likely have flared up the TMJ syndrome and could have caused some inflammation to the capsule/TMJ area.  Start mobic, ultram if needed.  Ok to place cold compress over sore area..  Soft foods, fluids only this week, and recheck in 2-3 days. Return to the clinic or go to the nearest emergency room if any of your symptoms worsen or new symptoms occur.

## 2012-07-25 NOTE — Progress Notes (Signed)
  Subjective:    Patient ID: Jenna Gray, female    DOB: 06/01/1981, 32 y.o.   MRN: [id number]  HPI  Jenna Gray is a 32 y.o. female  Eating dinner last night - felt pop and hiss and r side of jaw.  Now feels swollen, feels like opening "off" if opening too far.   Using straw this am as hurts to open too wide. Broth.  Still with popping if yawning this morning. No preceeding illness.   Tx: 800mg  ibuprofen.  Similar sx's in past - few years ago - dx with TMJ, - had swelling and soreness just on one side.    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   Review of Systems  Constitutional: Negative for fever and chills.  HENT: Negative for hearing loss, ear pain, congestion and rhinorrhea.        Objective:   Physical Exam  Vitals reviewed. Constitutional: She appears well-developed and well-nourished.  HENT:  Head: Normocephalic and atraumatic.  Right Ear: Tympanic membrane, external ear and ear canal normal.  Left Ear: Tympanic membrane, external ear and ear canal normal.  Mouth/Throat: Mucous membranes are normal.       No malalignment of teeth, opens and closes equally.  Palpable click noted at R TMJ, with ttp in this area. No LAD, and no appreciable face swelling.  Neck: Normal range of motion.  Cardiovascular: Normal rate, regular rhythm, normal heart sounds and intact distal pulses.   Pulmonary/Chest: Effort normal and breath sounds normal.  Lymphadenopathy:    She has no cervical adenopathy.  Skin: Skin is warm and dry.  Psychiatric: She has a normal mood and affect.       Assessment & Plan:  Jenna Gray is a 32 y.o. female 1. TMJ tenderness  meloxicam (MOBIC) 7.5 MG tablet, traMADol (ULTRAM) 50 MG tablet  2. TMJ click  meloxicam (MOBIC) 7.5 MG tablet  3. Jaw pain  traMADol (ULTRAM) 50 MG tablet   Suspected prior TMJ syndrome with flare vs capsular strain.  No sign of jaw dislocation on exam.  Will try mobic, ultram, cold compresses and recheck in  2-3 days.  Sooner if worse.  Consider maxillofacial eval.  Discussed fluids. Soft foods only for now.   Patient Instructions  You likely have flared up the TMJ syndrome and could have caused some inflammation to the capsule/TMJ area.  Start mobic, ultram if needed.  Ok to place cold compress over sore area..  Soft foods, fluids only this week, and recheck in 2-3 days. Return to the clinic or go to the nearest emergency room if any of your symptoms worsen or new symptoms occur.

## 2012-07-26 ENCOUNTER — Telehealth: Payer: Self-pay | Admitting: Radiology

## 2012-07-26 NOTE — Telephone Encounter (Signed)
Jenna Gray at the [facility] called and wanted verification on whether to fill the Tramadol prescribed yesterday. He stated patient picked up 180 tablets (with directions to take 1-2 every 8 hours) on July 04, 2012. Please pull database. The pharmacy will await a call back from our office before filling the prescription.

## 2012-07-26 NOTE — Telephone Encounter (Signed)
Ok to fill for acute TMJ pain, prescribed in small quantity, pt to f/u here in 2-3 days

## 2012-07-26 NOTE — Telephone Encounter (Signed)
PT SAW DR. Neva Seat AND HE CALLED HER IN A REFILL ON HER TRAMADOL.  WHEN SHE GOT TO THE PHARMACY THEY WOULDN'T FILL IT BECAUSE SHE WAS TO EARLY.  SHE WANTS TO KNOW IF YOU CAN CALL IN ANYTHING ELSE FOR HER FIBROMYALGIA ?  [phone]

## 2012-07-27 ENCOUNTER — Other Ambulatory Visit: Payer: Self-pay | Admitting: Radiology

## 2012-07-27 NOTE — Telephone Encounter (Signed)
Called [facility] to advise okay to fill the Tramadol/ per Frances Furbish.

## 2012-07-27 NOTE — Telephone Encounter (Signed)
Called pharmacy to advise ok to fill Tramadol

## 2012-08-07 ENCOUNTER — Encounter: Payer: 59 | Attending: Physical Medicine & Rehabilitation | Admitting: Physical Medicine & Rehabilitation

## 2012-08-07 DIAGNOSIS — R52 Pain, unspecified: Secondary | ICD-10-CM | POA: Insufficient documentation

## 2012-08-07 DIAGNOSIS — F411 Generalized anxiety disorder: Secondary | ICD-10-CM | POA: Insufficient documentation

## 2012-08-07 DIAGNOSIS — G478 Other sleep disorders: Secondary | ICD-10-CM | POA: Insufficient documentation

## 2012-08-07 DIAGNOSIS — D509 Iron deficiency anemia, unspecified: Secondary | ICD-10-CM | POA: Insufficient documentation

## 2012-08-07 DIAGNOSIS — IMO0001 Reserved for inherently not codable concepts without codable children: Secondary | ICD-10-CM | POA: Insufficient documentation

## 2012-08-23 ENCOUNTER — Emergency Department (HOSPITAL_COMMUNITY): Payer: 59

## 2012-08-23 ENCOUNTER — Emergency Department (HOSPITAL_COMMUNITY)
Admission: EM | Admit: 2012-08-23 | Discharge: 2012-08-23 | Disposition: A | Discharge status: Home / Self Care | Payer: 59 | Attending: Emergency Medicine | Admitting: Emergency Medicine

## 2012-08-23 ENCOUNTER — Encounter (HOSPITAL_COMMUNITY): Payer: Self-pay | Admitting: Emergency Medicine

## 2012-08-23 DIAGNOSIS — N200 Calculus of kidney: Secondary | ICD-10-CM

## 2012-08-23 DIAGNOSIS — Z3202 Encounter for pregnancy test, result negative: Secondary | ICD-10-CM | POA: Insufficient documentation

## 2012-08-23 DIAGNOSIS — Z8739 Personal history of other diseases of the musculoskeletal system and connective tissue: Secondary | ICD-10-CM | POA: Insufficient documentation

## 2012-08-23 DIAGNOSIS — Z8719 Personal history of other diseases of the digestive system: Secondary | ICD-10-CM | POA: Insufficient documentation

## 2012-08-23 DIAGNOSIS — Z8679 Personal history of other diseases of the circulatory system: Secondary | ICD-10-CM | POA: Insufficient documentation

## 2012-08-23 DIAGNOSIS — Z79899 Other long term (current) drug therapy: Secondary | ICD-10-CM | POA: Insufficient documentation

## 2012-08-23 DIAGNOSIS — Z791 Long term (current) use of non-steroidal anti-inflammatories (NSAID): Secondary | ICD-10-CM | POA: Insufficient documentation

## 2012-08-23 DIAGNOSIS — E039 Hypothyroidism, unspecified: Secondary | ICD-10-CM | POA: Insufficient documentation

## 2012-08-23 DIAGNOSIS — Z87442 Personal history of urinary calculi: Secondary | ICD-10-CM | POA: Insufficient documentation

## 2012-08-23 DIAGNOSIS — Z8742 Personal history of other diseases of the female genital tract: Secondary | ICD-10-CM | POA: Insufficient documentation

## 2012-08-23 DIAGNOSIS — Z792 Long term (current) use of antibiotics: Secondary | ICD-10-CM | POA: Insufficient documentation

## 2012-08-23 DIAGNOSIS — Z862 Personal history of diseases of the blood and blood-forming organs and certain disorders involving the immune mechanism: Secondary | ICD-10-CM | POA: Insufficient documentation

## 2012-08-23 HISTORY — DX: Hypothyroidism, unspecified: E03.9

## 2012-08-23 LAB — URINALYSIS, ROUTINE W REFLEX MICROSCOPIC
Bilirubin Urine: NEGATIVE
Hgb urine dipstick: NEGATIVE
Ketones, ur: NEGATIVE mg/dL
Nitrite: NEGATIVE
Urobilinogen, UA: 0.2 mg/dL (ref 0.0–1.0)

## 2012-08-23 MED ORDER — OXYCODONE-ACETAMINOPHEN 5-325 MG PO TABS: 1.0000 | ORAL_TABLET | ORAL | Status: DC | PRN

## 2012-08-23 MED ORDER — HYDROMORPHONE HCL PF 1 MG/ML IJ SOLN
1.0000 mg | Freq: Once | INTRAMUSCULAR | Status: AC
  Administered 2012-08-23: 1 mg via INTRAVENOUS
  Filled 2012-08-23: qty 1

## 2012-08-23 MED ORDER — KETOROLAC TROMETHAMINE 30 MG/ML IJ SOLN
30.0000 mg | Freq: Once | INTRAMUSCULAR | Status: AC
  Administered 2012-08-23: 30 mg via INTRAVENOUS
  Filled 2012-08-23: qty 1

## 2012-08-23 MED ORDER — SODIUM CHLORIDE 0.9 % IV SOLN
INTRAVENOUS | Status: DC
  Administered 2012-08-23: 07:00:00 via INTRAVENOUS

## 2012-08-23 MED ORDER — ONDANSETRON HCL 4 MG/2ML IJ SOLN
4.0000 mg | Freq: Once | INTRAMUSCULAR | Status: AC
  Administered 2012-08-23: 4 mg via INTRAVENOUS
  Filled 2012-08-23: qty 2

## 2012-08-23 NOTE — ED Provider Notes (Signed)
Received pt in signout She is feeling improved We discussed labs/CT results Stable for d/c  Joya Gaskins, MD 08/23/12 [phone]

## 2012-08-23 NOTE — ED Provider Notes (Signed)
History     CSN: [id number]  Arrival date & time 08/23/12  4098   First MD Initiated Contact with Patient 08/23/12 [phone]      Chief Complaint  Patient presents with  . Nephrolithiasis    (Consider location/radiation/quality/duration/timing/severity/associated sxs/prior treatment) HPI History provided by patient. Right flank pain x2 days. Has just finished her menses and unable to say if she has noticed blood in her urine. Pain is sharp in quality and not radiating. Patient has history of kidney stones and this feels similar to previous symptoms. No fevers or chills. Some nausea no vomiting. No dysuria, urgency or frequency. Has had previous appendectomy. No trauma. No rash. Pain is moderate to severe. No known aggravating or alleviating factors Past Medical History  Diagnosis Date  . History of ovarian cyst 02/2000    DERMOID, LEFT  . Migraine   . TMJ (temporomandibular joint syndrome)   . Fibromyalgia   . Anemia   . Right ureteral stone     Past Surgical History  Procedure Laterality Date  . Cystectomy      OVARIAN DERMOID  . Appendectomy      AGE 55  . Tubal ligation  2010    Family History  Problem Relation Age of Onset  . Hypertension Father   . Alcohol abuse Father   . Breast cancer Maternal Aunt   . Cancer Paternal Grandmother     COLON , PANCREATIC  . Hyperlipidemia Mother     History  Substance Use Topics  . Smoking status: Never Smoker   . Smokeless tobacco: Never Used  . Alcohol Use: No    OB History   Grav Para Term Preterm Abortions TAB SAB Ect Mult Living   4 4        4       Review of Systems  Constitutional: Negative for fever and chills.  HENT: Negative for neck pain and neck stiffness.   Eyes: Negative for pain.  Respiratory: Negative for shortness of breath.   Cardiovascular: Negative for chest pain.  Gastrointestinal: Negative for abdominal pain.  Genitourinary: Positive for flank pain. Negative for dysuria.  Musculoskeletal:  Negative for myalgias.  Skin: Negative for rash.  Neurological: Negative for headaches.  All other systems reviewed and are negative.    Allergies  Penicillins and Neurontin  Home Medications   Current Outpatient Rx  Name  Route  Sig  Dispense  Refill  . azithromycin (ZITHROMAX) 250 MG tablet      Take 2 tablets today then 1 tablet daily for the next 4 days   6 tablet   0   . buPROPion (WELLBUTRIN XL) 150 MG 24 hr tablet   Oral   Take 1 tablet (150 mg total) by mouth daily.   30 tablet   3   . clonazePAM (KLONOPIN) 0.5 MG tablet   Oral   Take 1 tablet (0.5 mg total) by mouth at bedtime as needed. For sleep   30 tablet   3   . levothyroxine (LEVOTHROID) 25 MCG tablet   Oral   Take 1 tablet (25 mcg total) by mouth daily.   30 tablet   3   . meloxicam (MOBIC) 7.5 MG tablet   Oral   Take 1 tablet (7.5 mg total) by mouth daily.   30 tablet   0   . methocarbamol (ROBAXIN) 500 MG tablet   Oral   Take 1 tablet (500 mg total) by mouth 3 (three) times daily.   90 tablet  1   . naproxen (NAPROSYN) 500 MG tablet   Oral   Take 1 tablet (500 mg total) by mouth 2 (two) times daily with a meal.   60 tablet   0   . Norgestim-Eth Estrad Triphasic (ORTHO TRI-CYCLEN, 28, PO)   Oral   Take 1 tablet by mouth every evening.          . ranitidine (ZANTAC) 150 MG tablet   Oral   Take 1 tablet (150 mg total) by mouth 2 (two) times daily.   180 tablet   1   . traMADol (ULTRAM) 50 MG tablet   Oral   Take 2 tablets (100 mg total) by mouth every 6 (six) hours as needed for pain.   20 tablet   0     BP 145/90  Pulse 92  Temp(Src) 98.4 F (36.9 C) (Oral)  Resp 18  SpO2 98%  LMP 07/18/2012  Physical Exam  Constitutional: She is oriented to person, place, and time. She appears well-developed and well-nourished.  HENT:  Head: Normocephalic and atraumatic.  Eyes: EOM are normal. Pupils are equal, round, and reactive to light. No scleral icterus.  Neck: Neck  supple.  Cardiovascular: Regular rhythm and intact distal pulses.   Pulmonary/Chest: Effort normal. No respiratory distress.  Abdominal: Soft. Bowel sounds are normal. She exhibits no distension. There is no rebound and no guarding.  No CVA tenderness. Mild right flank tenderness. No right upper quadrant tenderness with negative Murphy's sign  Musculoskeletal: Normal range of motion. She exhibits no edema.  Neurological: She is alert and oriented to person, place, and time.  Skin: Skin is warm and dry. No rash noted.    ED Course  Procedures (including critical care time)   IV Dilaudid. IV Zofran. IV Toradol.   MDM  Right flank pain in patient with history of kidney stones  Evaluated with urinalysis and CT scan  Treated with IV fluids and IV narcotics  Vital signs and nursing notes reviewed and considered        Sunnie Nielsen, MD 08/24/12 0430

## 2012-08-23 NOTE — ED Notes (Signed)
Patient transported to CT 

## 2012-08-23 NOTE — ED Notes (Signed)
Pt c/o right flank pain for a "Few days" Pt states " it feels like a Kidney stone."

## 2012-08-26 ENCOUNTER — Other Ambulatory Visit: Payer: Self-pay

## 2012-09-11 ENCOUNTER — Encounter: Payer: 59 | Admitting: Women's Health

## 2012-09-26 ENCOUNTER — Encounter: Payer: 59 | Admitting: Family Medicine

## 2012-09-28 ENCOUNTER — Telehealth: Payer: Self-pay | Admitting: *Deleted

## 2012-09-28 NOTE — Telephone Encounter (Signed)
FYI  We have not seen this patient since 06/02/12, but we got a call from [facility] and she has multiple pharmacies and multiple prescribers for her tramadol.  It looks like her UDS was inconsistent and had an extremely high tramadol level. She does not have another appt scheduled, but FYI just in case she makes one.

## 2012-09-28 NOTE — Telephone Encounter (Signed)
Ok, thank you

## 2012-10-19 ENCOUNTER — Ambulatory Visit (INDEPENDENT_AMBULATORY_CARE_PROVIDER_SITE_OTHER): Payer: 59 | Admitting: Family Medicine

## 2012-10-19 VITALS — BP 120/92 | HR 88 | Temp 98.5°F | Resp 16 | Ht 61.5 in | Wt 168.0 lb

## 2012-10-19 DIAGNOSIS — G43909 Migraine, unspecified, not intractable, without status migrainosus: Secondary | ICD-10-CM

## 2012-10-19 MED ORDER — KETOROLAC TROMETHAMINE 30 MG/ML IJ SOLN
30.0000 mg | Freq: Once | INTRAMUSCULAR | Status: AC
  Administered 2012-10-19: 30 mg via INTRAMUSCULAR

## 2012-10-19 MED ORDER — ONDANSETRON HCL 8 MG PO TABS: 8.0000 mg | ORAL_TABLET | Freq: Three times a day (TID) | ORAL | Status: DC | PRN

## 2012-10-19 MED ORDER — ONDANSETRON 4 MG PO TBDP
8.0000 mg | ORAL_TABLET | Freq: Once | ORAL | Status: AC
  Administered 2012-10-19: 8 mg via ORAL

## 2012-10-19 NOTE — Progress Notes (Addendum)
[facility] [address], [place] [address] [phone]  Date:  10/19/2012   Name:  Jenna Gray   DOB:  Dec 01, 1980   MRN:  [id number]  PCP:  Neena Rhymes, MD    Chief Complaint: Migraine   History of Present Illness:  Jenna Gray is a 32 y.o. very pleasant female patient who presents with the following:  She works third shift.  After she woke up on Monday she noted that she had a HA.  She tried some OTC medications and imitrex, but these measures have not helped so far.   She has felt nauseated, but no vomiting.  She does have photo and phonophobia. She has migraine HA maybe twice a year, has had these for a few years.  She does take tramadol for fibromyalgia.   We have treated her here in the past with "a shot."  This does seem like a typical migraine HA for her.   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   No fever, ST, or other sign of illness that she is aware of LMP was about 10 days ago.  history of BTL, she has 4 sons.   She takes 2 tramadol every 8 hours- she is still doing this now. Her last dose was at about midnight last night.    She tried tylenol yesterday at noon, and a goody powder last night around 8pm  Patient Active Problem List  Diagnosis  . Insomnia  . General medical examination  . GERD (gastroesophageal reflux disease)  . Anxiety  . Fibromyalgia  . Plantar fasciitis  . Myofascial muscle pain  . Iron deficiency anemia  . Hypothyroid    Past Medical History  Diagnosis Date  . History of ovarian cyst 02/2000    DERMOID, LEFT  . Migraine   . TMJ (temporomandibular joint syndrome)   . Fibromyalgia   . Anemia   . Right ureteral stone   . Hypothyroidism 05/2012    Past Surgical History  Procedure Laterality Date  . Cystectomy      OVARIAN DERMOID  . Appendectomy      AGE 18  . Tubal ligation  2010  . Lithotripsy      History  Substance Use Topics  . Smoking  status: Never Smoker   . Smokeless tobacco: Never Used  . Alcohol Use: No    Family History  Problem Relation Age of Onset  . Hypertension Father   . Alcohol abuse Father   . Breast cancer Maternal Aunt   . Cancer Paternal Grandmother     COLON , PANCREATIC  . Hyperlipidemia Mother     Allergies  Allergen Reactions  . Penicillins Hives and Itching  . Neurontin (Gabapentin)     headache    Medication list has been reviewed and updated.  Current Outpatient Prescriptions on File Prior to Visit  Medication Sig Dispense Refill  . ferrous sulfate 325 (65 FE) MG tablet Take 1 tablet by mouth 2 (two) times daily.      Marland Kitchen ibuprofen (ADVIL,MOTRIN) 200 MG tablet Take 200-800 mg by mouth every 6 (six) hours as needed for pain. Headache or pain      . levothyroxine (LEVOTHROID) 25 MCG tablet Take 1 tablet (25 mcg total) by mouth daily.  30 tablet  3  . naproxen (NAPROSYN) 500 MG tablet Take 1 tablet (500 mg total) by mouth 2 (two) times daily with a  meal.  60 tablet  0  . ranitidine (ZANTAC) 150 MG tablet Take 1 tablet (150 mg total) by mouth 2 (two) times daily.  180 tablet  1  . traMADol (ULTRAM) 50 MG tablet Take 2 tablets (100 mg total) by mouth every 6 (six) hours as needed for pain.  20 tablet  0  . meloxicam (MOBIC) 7.5 MG tablet Take 1 tablet (7.5 mg total) by mouth daily.  30 tablet  0  . methocarbamol (ROBAXIN) 500 MG tablet Take 1 tablet (500 mg total) by mouth 3 (three) times daily.  90 tablet  1  . Norgestim-Eth Estrad Triphasic (ORTHO TRI-CYCLEN, 28, PO) Take 1 tablet by mouth every evening.       Marland Kitchen oxyCODONE-acetaminophen (PERCOCET/ROXICET) 5-325 MG per tablet Take 1 tablet by mouth every 4 (four) hours as needed for pain.  5 tablet  0  . Prenatal Vit-Fe Fumarate-FA (PRENATAL MULTIVITAMIN) TABS Take 1 tablet by mouth daily.       No current facility-administered medications on file prior to visit.    Review of Systems:  As per HPI- otherwise negative.   Physical  Examination: Filed Vitals:   10/19/12 0809  BP: 120/92  Pulse: 88  Temp: 98.5 F (36.9 C)  Resp: 16   Filed Vitals:   10/19/12 0809  Height: 5' 1.5" (1.562 m)  Weight: 168 lb (76.204 kg)   Body mass index is 31.23 kg/(m^2). Ideal Body Weight: Weight in (lb) to have BMI = 25: 134.2  GEN: WDWN, NAD, Non-toxic, A & O x 3 HEENT: Atraumatic, Normocephalic. Neck supple. No masses, No LAD.  Bilateral TM wnl, oropharynx normal.  PEERL,EOMI.   Ears and Nose: No external deformity. CV: RRR, No M/G/R. No JVD. No thrill. No extra heart sounds. PULM: CTA B, no wheezes, crackles, rhonchi. No retractions. No resp. distress. No accessory muscle use. ABD: S, NT, ND. No rebound. No HSM. EXTR: No c/c/e NEURO Normal gait.  PSYCH: Normally interactive. Conversant. Not depressed or anxious appearing.  Calm demeanor.  Neuro exam is normal, normal extremity strength, sensation and DTR, negative romberg.  Normal gait  Spoke with PharmD- ok to give a dose of toradol with goody powder yesterday, but no further NSIADs today.   Given 30 mg of toradol and 8mg  of zofran po while in office.   Assessment and Plan: Migraine - Plan: ketorolac (TORADOL) 30 MG/ML injection 30 mg, ondansetron (ZOFRAN) 8 MG tablet, ondansetron (ZOFRAN-ODT) disintegrating tablet 8 mg  Migraine HA- treat with toradol and zofran as needed for nausea.  She is to come back or call if not better today   Signed Abbe Amsterdam, MD

## 2012-10-19 NOTE — Patient Instructions (Addendum)
Let us know if you are not feeling better later today.   Do not take any other NSAID medications today such as goody powders or ibuprofen/ aleve/ naproxen.    Use the zofran as needed for nausea

## 2013-02-18 ENCOUNTER — Emergency Department (HOSPITAL_BASED_OUTPATIENT_CLINIC_OR_DEPARTMENT_OTHER)
Admission: EM | Admit: 2013-02-18 | Discharge: 2013-02-18 | Disposition: A | Discharge status: Home / Self Care | Payer: 59 | Attending: Emergency Medicine | Admitting: Emergency Medicine

## 2013-02-18 ENCOUNTER — Encounter (HOSPITAL_BASED_OUTPATIENT_CLINIC_OR_DEPARTMENT_OTHER): Payer: Self-pay | Admitting: Emergency Medicine

## 2013-02-18 DIAGNOSIS — Z880 Allergy status to penicillin: Secondary | ICD-10-CM | POA: Insufficient documentation

## 2013-02-18 DIAGNOSIS — Z8742 Personal history of other diseases of the female genital tract: Secondary | ICD-10-CM | POA: Insufficient documentation

## 2013-02-18 DIAGNOSIS — E039 Hypothyroidism, unspecified: Secondary | ICD-10-CM | POA: Insufficient documentation

## 2013-02-18 DIAGNOSIS — Z87442 Personal history of urinary calculi: Secondary | ICD-10-CM | POA: Insufficient documentation

## 2013-02-18 DIAGNOSIS — N898 Other specified noninflammatory disorders of vagina: Secondary | ICD-10-CM | POA: Insufficient documentation

## 2013-02-18 DIAGNOSIS — N23 Unspecified renal colic: Secondary | ICD-10-CM | POA: Insufficient documentation

## 2013-02-18 DIAGNOSIS — Z79899 Other long term (current) drug therapy: Secondary | ICD-10-CM | POA: Insufficient documentation

## 2013-02-18 DIAGNOSIS — Z8719 Personal history of other diseases of the digestive system: Secondary | ICD-10-CM | POA: Insufficient documentation

## 2013-02-18 DIAGNOSIS — Z8679 Personal history of other diseases of the circulatory system: Secondary | ICD-10-CM | POA: Insufficient documentation

## 2013-02-18 DIAGNOSIS — R110 Nausea: Secondary | ICD-10-CM | POA: Insufficient documentation

## 2013-02-18 DIAGNOSIS — Z87448 Personal history of other diseases of urinary system: Secondary | ICD-10-CM | POA: Insufficient documentation

## 2013-02-18 DIAGNOSIS — Z9851 Tubal ligation status: Secondary | ICD-10-CM | POA: Insufficient documentation

## 2013-02-18 DIAGNOSIS — Z3202 Encounter for pregnancy test, result negative: Secondary | ICD-10-CM | POA: Insufficient documentation

## 2013-02-18 DIAGNOSIS — Z9089 Acquired absence of other organs: Secondary | ICD-10-CM | POA: Insufficient documentation

## 2013-02-18 DIAGNOSIS — Z8739 Personal history of other diseases of the musculoskeletal system and connective tissue: Secondary | ICD-10-CM | POA: Insufficient documentation

## 2013-02-18 DIAGNOSIS — D649 Anemia, unspecified: Secondary | ICD-10-CM | POA: Insufficient documentation

## 2013-02-18 HISTORY — DX: Disorder of kidney and ureter, unspecified: N28.9

## 2013-02-18 LAB — URINALYSIS, ROUTINE W REFLEX MICROSCOPIC
Glucose, UA: NEGATIVE mg/dL
Ketones, ur: 15 mg/dL — AB
Protein, ur: NEGATIVE mg/dL
Urobilinogen, UA: 0.2 mg/dL (ref 0.0–1.0)

## 2013-02-18 LAB — URINE MICROSCOPIC-ADD ON

## 2013-02-18 MED ORDER — OXYCODONE-ACETAMINOPHEN 5-325 MG PO TABS: 2.0000 | ORAL_TABLET | ORAL | Status: DC | PRN

## 2013-02-18 MED ORDER — ONDANSETRON HCL 4 MG PO TABS: 4.0000 mg | ORAL_TABLET | Freq: Four times a day (QID) | ORAL | Status: DC

## 2013-02-18 MED ORDER — OXYCODONE-ACETAMINOPHEN 5-325 MG PO TABS
2.0000 | ORAL_TABLET | Freq: Once | ORAL | Status: AC
  Administered 2013-02-18: 2 via ORAL
  Filled 2013-02-18 (×2): qty 2

## 2013-02-18 NOTE — ED Provider Notes (Signed)
CSN: [id number]     Arrival date & time 02/18/13  1512 History    This chart was scribed for Jenna Baker, MD by Quintella Reichert, ED scribe.  This patient was seen in room MH06/MH06 and the patient's care was started at 4:27 PM.     Chief Complaint  Patient presents with  . Flank Pain    The history is provided by the patient. No language interpreter was used.    HPI Comments: Jenna Gray is a 32 y.o. female with h/o kidney stones who presents to the Emergency Department complaining of right flank pain that began 2 days ago.  Pain is described as sharp and intermittent.  It is relieved slightly by bending forwards.  Pt states it is similar to her past kidney stones but more severe.  She also complains of nausea and some abnormal vaginal bleeding.  LNMP ended one week ago.  She denies emesis, fever, hematuria, or any other associated symptoms.  She has had kidney stones about 8 times and notes she received lithotripsy one time.  She states she normally is able to pass the stones but her present pain is more severe than is typical.   Past Medical History  Diagnosis Date  . History of ovarian cyst 02/2000    DERMOID, LEFT  . Migraine   . TMJ (temporomandibular joint syndrome)   . Fibromyalgia   . Anemia   . Right ureteral stone   . Hypothyroidism 05/2012  . Renal disorder     Past Surgical History  Procedure Laterality Date  . Cystectomy      OVARIAN DERMOID  . Appendectomy      AGE 28  . Tubal ligation  2010  . Lithotripsy      Family History  Problem Relation Age of Onset  . Hypertension Father   . Alcohol abuse Father   . Breast cancer Maternal Aunt   . Cancer Paternal Grandmother     COLON , PANCREATIC  . Hyperlipidemia Mother     History  Substance Use Topics  . Smoking status: Never Smoker   . Smokeless tobacco: Never Used  . Alcohol Use: No    OB History   Grav Para Term Preterm Abortions TAB SAB Ect Mult Living   4 4        4        Review  of Systems  Constitutional: Negative for fever.  Gastrointestinal: Positive for nausea. Negative for vomiting.  Genitourinary: Positive for flank pain and vaginal bleeding. Negative for hematuria.  All other systems reviewed and are negative.      Allergies  Penicillins and Neurontin  Home Medications   Current Outpatient Rx  Name  Route  Sig  Dispense  Refill  . pantoprazole (PROTONIX) 40 MG tablet   Oral   Take 40 mg by mouth daily.         . ferrous sulfate 325 (65 FE) MG tablet   Oral   Take 1 tablet by mouth 2 (two) times daily.         Marland Kitchen ibuprofen (ADVIL,MOTRIN) 200 MG tablet   Oral   Take 200-800 mg by mouth every 6 (six) hours as needed for pain. Headache or pain         . levothyroxine (LEVOTHROID) 25 MCG tablet   Oral   Take 1 tablet (25 mcg total) by mouth daily.   30 tablet   3   . ondansetron (ZOFRAN) 8 MG tablet  Oral   Take 1 tablet (8 mg total) by mouth every 8 (eight) hours as needed for nausea.   15 tablet   0   . Prenatal Vit-Fe Fumarate-FA (PRENATAL MULTIVITAMIN) TABS   Oral   Take 1 tablet by mouth daily.         . ranitidine (ZANTAC) 150 MG tablet   Oral   Take 1 tablet (150 mg total) by mouth 2 (two) times daily.   180 tablet   1   . traMADol (ULTRAM) 50 MG tablet   Oral   Take 2 tablets (100 mg total) by mouth every 6 (six) hours as needed for pain.   20 tablet   0    BP 161/98  Pulse 98  Temp(Src) 99 F (37.2 C) (Oral)  Resp 16  Ht 5\' 1"  (1.549 m)  Wt 175 lb (79.379 kg)  BMI 33.08 kg/m2  SpO2 100%  LMP 02/04/2013  Physical Exam  Nursing note and vitals reviewed. Constitutional: She is oriented to person, place, and time. She appears well-developed and well-nourished.  Non-toxic appearance. No distress.  HENT:  Head: Normocephalic and atraumatic.  Eyes: Conjunctivae, EOM and lids are normal. Pupils are equal, round, and reactive to light.  Neck: Normal range of motion. Neck supple. No tracheal deviation  present. No mass present.  Cardiovascular: Normal rate, regular rhythm and normal heart sounds.  Exam reveals no gallop.   No murmur heard. Pulmonary/Chest: Effort normal and breath sounds normal. No stridor. No respiratory distress. She has no decreased breath sounds. She has no wheezes. She has no rhonchi. She has no rales.  Abdominal: Soft. Normal appearance and bowel sounds are normal. She exhibits no distension. There is no tenderness. There is no rebound and no CVA tenderness.  Musculoskeletal: Normal range of motion. She exhibits no edema and no tenderness.  Neurological: She is alert and oriented to person, place, and time. She has normal strength. No cranial nerve deficit or sensory deficit. GCS eye subscore is 4. GCS verbal subscore is 5. GCS motor subscore is 6.  Skin: Skin is warm and dry. No abrasion and no rash noted.  Psychiatric: She has a normal mood and affect. Her speech is normal and behavior is normal.    ED Course  Procedures (including critical care time)  DIAGNOSTIC STUDIES: Oxygen Saturation is 100% on room air, normal by my interpretation.    COORDINATION OF CARE: 4:30 PM-Offered pt CT scan but pt deferred.  Discussed treatment plan which includes symptomatic relief with pain medication and f/u with urologist.  Pt expressed understanding and agreed to plan.     Labs Reviewed  URINALYSIS, ROUTINE W REFLEX MICROSCOPIC - Abnormal; Notable for the following:    Hgb urine dipstick MODERATE (*)    Ketones, ur 15 (*)    All other components within normal limits  URINE MICROSCOPIC-ADD ON - Abnormal; Notable for the following:    Squamous Epithelial / LPF FEW (*)    Bacteria, UA MANY (*)    All other components within normal limits  PREGNANCY, URINE   No results found.  No diagnosis found.  MDM  Patient agreeable to plans as above   I personally performed the services described in this documentation, which was scribed in my presence. The recorded information  has been reviewed and is accurate.      Jenna Baker, MD 02/18/13 [phone]

## 2013-02-18 NOTE — ED Notes (Signed)
Right flank pain x2 days.  Hx of kidney stones.  Sts she can usually pass them but this one is worse.

## 2013-05-17 ENCOUNTER — Other Ambulatory Visit: Payer: Self-pay

## 2013-06-22 ENCOUNTER — Ambulatory Visit (INDEPENDENT_AMBULATORY_CARE_PROVIDER_SITE_OTHER): Payer: 59 | Admitting: Internal Medicine

## 2013-06-22 VITALS — BP 128/80 | HR 78 | Temp 98.0°F | Resp 18 | Ht 61.5 in | Wt 168.0 lb

## 2013-06-22 DIAGNOSIS — R6884 Jaw pain: Secondary | ICD-10-CM

## 2013-06-22 DIAGNOSIS — E663 Overweight: Secondary | ICD-10-CM

## 2013-06-22 DIAGNOSIS — M797 Fibromyalgia: Secondary | ICD-10-CM

## 2013-06-22 DIAGNOSIS — D649 Anemia, unspecified: Secondary | ICD-10-CM

## 2013-06-22 DIAGNOSIS — N200 Calculus of kidney: Secondary | ICD-10-CM

## 2013-06-22 DIAGNOSIS — IMO0001 Reserved for inherently not codable concepts without codable children: Secondary | ICD-10-CM

## 2013-06-22 DIAGNOSIS — G478 Other sleep disorders: Secondary | ICD-10-CM

## 2013-06-22 DIAGNOSIS — E785 Hyperlipidemia, unspecified: Secondary | ICD-10-CM

## 2013-06-22 DIAGNOSIS — K219 Gastro-esophageal reflux disease without esophagitis: Secondary | ICD-10-CM

## 2013-06-22 DIAGNOSIS — M26629 Arthralgia of temporomandibular joint, unspecified side: Secondary | ICD-10-CM

## 2013-06-22 DIAGNOSIS — E039 Hypothyroidism, unspecified: Secondary | ICD-10-CM

## 2013-06-22 LAB — POCT CBC
HCT, POC: 36.4 % — AB (ref 37.7–47.9)
Lymph, poc: 3 (ref 0.6–3.4)
MCH, POC: 21.6 pg — AB (ref 27–31.2)
MCHC: 29.7 g/dL — AB (ref 31.8–35.4)
MCV: 72.8 fL — AB (ref 80–97)
MID (cbc): 0.5 (ref 0–0.9)
POC LYMPH PERCENT: 35.4 %L (ref 10–50)
RDW, POC: 17 %

## 2013-06-22 LAB — COMPREHENSIVE METABOLIC PANEL
Albumin: 4.2 g/dL (ref 3.5–5.2)
Alkaline Phosphatase: 51 U/L (ref 39–117)
BUN: 14 mg/dL (ref 6–23)
Creat: 0.95 mg/dL (ref 0.50–1.10)
Glucose, Bld: 89 mg/dL (ref 70–99)
Total Bilirubin: 0.3 mg/dL (ref 0.3–1.2)

## 2013-06-22 LAB — IRON AND TIBC
%SAT: 3 % — ABNORMAL LOW (ref 20–55)
Iron: 13 ug/dL — ABNORMAL LOW (ref 42–145)
TIBC: 460 ug/dL (ref 250–470)
UIBC: 447 ug/dL — ABNORMAL HIGH (ref 125–400)

## 2013-06-22 LAB — T4, FREE: Free T4: 0.97 ng/dL (ref 0.80–1.80)

## 2013-06-22 MED ORDER — PANTOPRAZOLE SODIUM 40 MG PO TBEC: 40.0000 mg | DELAYED_RELEASE_TABLET | Freq: Every day | ORAL | Status: DC

## 2013-06-22 MED ORDER — AMITRIPTYLINE HCL 25 MG PO TABS: 25.0000 mg | ORAL_TABLET | Freq: Every day | ORAL | Status: DC

## 2013-06-22 MED ORDER — TRAMADOL HCL 50 MG PO TABS: 50.0000 mg | ORAL_TABLET | Freq: Four times a day (QID) | ORAL | Status: DC | PRN

## 2013-06-22 NOTE — Progress Notes (Addendum)
Subjective:    Patient ID: Jenna Gray, female    DOB: [birth year], 32 y.o.   MRN: [id number]  HPI This chart was scribed for [facility], by Ladona Ridgel Edi Gorniak, Scribe. This patient was seen in room 4 and the patient's care was started at 10:26 AM.  HPI Comments: Jenna Gray is a 32 y.o. female who presents [facility] the [facility] for medication refill. She lives at home with her 4 children and husband. She works the 3rd shift at [employer].   She reports medicated for hypothyroidism, GERD and fibromyalgia which she takes synthroid, Protonix and tramadol for. She reports was seeing Dr. Jorge Mandril for these medications but he has recently moved out of town from [facility]. She states the medicines have been working well for her and has approximately been medicated for thyroid disease for about 2 years. She has had GERD for a couple of years, she has been on protonix for past year which seems to be working better than omeprazole. She is also on tramadol for fibromyalgia. She has not had a TSH level in the past year. She is unsure of her last cholesterol level.  She had an episode of anemia last year, unsure what was causing her anemia.   She reports with the sleep irregularities of 3rd shift, she is a very light sleeper and has trouble falling back asleep if she is woken up. She denies trouble with snoring.   This past August, she had an episode of kidney stones. She also had kidney stones removed surgically years ago.   Past Medical History  Diagnosis Date  . History of ovarian cyst 02/2000    DERMOID, LEFT  . Migraine   . TMJ (temporomandibular joint syndrome)   . Fibromyalgia   . Anemia   . Right ureteral stone   . Hypothyroidism 05/2012  . Renal disorder    Past Surgical History  Procedure Laterality Date  . Cystectomy      OVARIAN DERMOID  . Appendectomy      AGE 71  . Tubal ligation  2010  . Lithotripsy     Family History  Problem Relation  Age of Onset  . Hypertension Father   . Alcohol abuse Father   . Breast cancer Maternal Aunt   . Cancer Paternal Grandmother     COLON , PANCREATIC  . Hyperlipidemia Mother    History   Social History  . Marital Status: Single    Spouse Name: N/A    Number of Children: N/A  . Years of Education: N/A   Occupational History  . Not on file.   Social History Main Topics  . Smoking status: Never Smoker   . Smokeless tobacco: Never Used  . Alcohol Use: No  . Drug Use: No  . Sexual Activity: Yes    Partners: Male    Birth Control/ Protection: Surgical   Other Topics Concern  . Not on file   Social History Narrative  . No narrative on file   Allergies  Allergen Reactions  . Penicillins Hives and Itching  . Neurontin [Gabapentin]     headache   Patient Active Problem List   Diagnosis Date Noted  . Hypothyroid 05/12/2012  . Myofascial muscle pain 04/14/2012  . Iron deficiency anemia 04/14/2012  . Anxiety 11/22/2011  . Fibromyalgia 11/22/2011  . Plantar fasciitis 11/22/2011  . General medical examination 09/22/2011  . GERD (gastroesophageal reflux disease) 09/22/2011  . Insomnia 08/20/2011   No  orders of the defined types were placed in this encounter.    Review of Systems  Constitutional: Negative for fever and chills.  Respiratory: Negative for cough.   Gastrointestinal: Negative for abdominal pain.  Skin: Negative for color change and rash.  Neurological: Negative for syncope and headaches.  Psychiatric/Behavioral: Positive for sleep disturbance (difficulty falling back asleep).   Grinds teeth     Objective:   Physical Exam  Nursing note and vitals reviewed. Constitutional: She is oriented to person, place, and time. She appears well-developed and well-nourished. No distress.  HENT:  Head: Normocephalic and atraumatic.  Eyes: Conjunctivae are normal. Right eye exhibits no discharge. Left eye exhibits no discharge.  Neck: Normal range of motion. No  tracheal deviation present.  Cardiovascular: Normal rate.   Pulmonary/Chest: Effort normal. No respiratory distress.  Musculoskeletal: Normal range of motion. She exhibits no edema.  Neurological: She is alert and oriented to person, place, and time.  Skin: Skin is warm and dry.  Psychiatric: She has a normal mood and affect. Thought content normal.     Triage Vitals: BP 128/80  Pulse 78  Temp(Src) 98 F (36.7 C) (Oral)  Resp 18  Ht 5' 1.5" (1.562 m)  Wt 168 lb (76.204 kg)  BMI 31.23 kg/m2  SpO2 100%  LMP 06/05/2013  Assessment & Plan:  I have completed the patient encounter in its entirety as documented by the scribe, with editing by me where necessary. Robert P. Merla Riches, M.D. Unspecified hypothyroidism - Plan: Comprehensive metabolic panel, TSH, T4, free  Anemia - Plan: POCT CBC, Iron and TIBC  GERD (gastroesophageal reflux disease)  Fibromyalgia  Kidney stones  Other and unspecified hyperlipidemia  Overweight  Poor sleep pattern----trial elavil  TMJ tenderness - Plan: traMADol (ULTRAM) 50 MG tablet  Jaw pain - Plan: traMADol (ULTRAM) 50 MG tablet  Meds ordered this encounter  Medications  . pantoprazole (PROTONIX) 40 MG tablet    Sig: Take 1 tablet (40 mg total) by mouth daily.    Dispense:  90 tablet    Refill:  3  . traMADol (ULTRAM) 50 MG tablet    Sig: Take 1-2 tablets (50-100 mg total) by mouth every 6 (six) hours as needed.    Dispense:  120 tablet    Refill:  5  . amitriptyline (ELAVIL) 25 MG tablet    Sig: Take 1 tablet (25 mg total) by mouth at bedtime.    Dispense:  30 tablet    Refill:  5   Notify labs F/u Dr Clelia Croft appt 3 mos 1025 AM - Processed prescription refills to [facility] for her fibromyalgia, GERD and hypothyroidism. Also, ordering lab work for cholesterol and TSH.    Addendum: Results for orders placed in visit on 06/22/13  COMPREHENSIVE METABOLIC PANEL      Result Value Range   Sodium 137  135 - 145 mEq/L   Potassium  4.1  3.5 - 5.3 mEq/L   Chloride 104  96 - 112 mEq/L   CO2 24  19 - 32 mEq/L   Glucose, Bld 89  70 - 99 mg/dL   BUN 14  6 - 23 mg/dL   Creat 1.61  0.96 - 0.45 mg/dL   Total Bilirubin 0.3  0.3 - 1.2 mg/dL   Alkaline Phosphatase 51  39 - 117 U/L   AST 16  0 - 37 U/L   ALT 12  0 - 35 U/L   Total Protein 7.8  6.0 - 8.3 g/dL   Albumin 4.2  3.5 - 5.2 g/dL   Calcium 9.4  8.4 - 65.7 mg/dL  TSH      Result Value Range   TSH 2.662  0.350 - 4.500 uIU/mL  T4, FREE      Result Value Range   Free T4 0.97  0.80 - 1.80 ng/dL  IRON AND TIBC      Result Value Range   Iron 13 (*) 42 - 145 ug/dL   UIBC 846 (*) 962 - 952 ug/dL   TIBC 841  324 - 401 ug/dL   %SAT 3 (*) 20 - 55 %  POCT CBC      Result Value Range   WBC 8.6  4.6 - 10.2 K/uL   Lymph, poc 3.0  0.6 - 3.4   POC LYMPH PERCENT 35.4  10 - 50 %L   MID (cbc) 0.5  0 - 0.9   POC MID % 6.1  0 - 12 %M   POC Granulocyte 5.0  2 - 6.9   Granulocyte percent 58.5  37 - 80 %G   RBC 5.00  4.04 - 5.48 M/uL   Hemoglobin 10.8 (*) 12.2 - 16.2 g/dL   HCT, POC 02.7 (*) 25.3 - 47.9 %   MCV 72.8 (*) 80 - 97 fL   MCH, POC 21.6 (*) 27 - 31.2 pg   MCHC 29.7 (*) 31.8 - 35.4 g/dL   RDW, POC 66.4     Platelet Count, POC 308  142 - 424 K/uL   MPV 10.1  0 - 99.8 fL   Will address fe def(feso4 caused nausea-gastritis)

## 2013-06-24 ENCOUNTER — Encounter: Payer: Self-pay | Admitting: Internal Medicine

## 2013-08-10 ENCOUNTER — Emergency Department (HOSPITAL_COMMUNITY)
Admission: EM | Admit: 2013-08-10 | Discharge: 2013-08-10 | Disposition: A | Discharge status: Home / Self Care | Payer: 59 | Source: Home / Self Care | Attending: Family Medicine | Admitting: Family Medicine

## 2013-08-10 ENCOUNTER — Encounter (HOSPITAL_COMMUNITY): Payer: Self-pay | Admitting: Emergency Medicine

## 2013-08-10 DIAGNOSIS — K08531 Fractured dental restorative material with loss of material: Secondary | ICD-10-CM

## 2013-08-10 MED ORDER — OXYCODONE-ACETAMINOPHEN 5-325 MG PO TABS: 1.0000 | ORAL_TABLET | ORAL | Status: DC | PRN

## 2013-08-10 NOTE — Discharge Instructions (Signed)
Dear Ms. Fahs,   Please take the pain medication as needed and avoid eating firm foods on that side. I hope that you get relief soon.    Take care,   Dr. Maricela Bo

## 2013-08-10 NOTE — ED Provider Notes (Signed)
CSN: [id number]     Arrival date & time 08/10/13  1937 History   First MD Initiated Contact with Patient 08/10/13 2040     Chief Complaint  Patient presents with  . Dental Problem   (Consider location/radiation/quality/duration/timing/severity/associated sxs/prior Treatment) HPI  33 year old F recently lost temporary cap on her right first lower right molar. This happened while eating so she schedule an appt with her dentist on Tuesday. However, she notes that last night she developed severe throbbing pain in that area that has not subsided. She is concerned that a food particle may be lodged in her fractured tooth. She denies fever, chills, or drainage. No halitosis.   Past Medical History  Diagnosis Date  . History of ovarian cyst 02/2000    DERMOID, LEFT  . Migraine   . TMJ (temporomandibular joint syndrome)   . Fibromyalgia   . Anemia   . Right ureteral stone   . Hypothyroidism 05/2012  . Renal disorder    Past Surgical History  Procedure Laterality Date  . Cystectomy      OVARIAN DERMOID  . Appendectomy      AGE 12  . Tubal ligation  2010  . Lithotripsy     Family History  Problem Relation Age of Onset  . Hypertension Father   . Alcohol abuse Father   . Breast cancer Maternal Aunt   . Cancer Paternal Grandmother     COLON , PANCREATIC  . Hyperlipidemia Mother    History  Substance Use Topics  . Smoking status: Never Smoker   . Smokeless tobacco: Never Used  . Alcohol Use: No   OB History   Grav Para Term Preterm Abortions TAB SAB Ect Mult Living   4 4        4      Review of Systems  Allergies  Penicillins and Neurontin  Home Medications   Current Outpatient Rx  Name  Route  Sig  Dispense  Refill  . amitriptyline (ELAVIL) 25 MG tablet   Oral   Take 1 tablet (25 mg total) by mouth at bedtime.   30 tablet   5   . ferrous sulfate 325 (65 FE) MG tablet   Oral   Take 1 tablet by mouth 2 (two) times daily.         Marland Kitchen ibuprofen (ADVIL,MOTRIN) 200  MG tablet   Oral   Take 200-800 mg by mouth every 6 (six) hours as needed for pain. Headache or pain         . pantoprazole (PROTONIX) 40 MG tablet   Oral   Take 1 tablet (40 mg total) by mouth daily.   90 tablet   3   . traMADol (ULTRAM) 50 MG tablet   Oral   Take 1-2 tablets (50-100 mg total) by mouth every 6 (six) hours as needed.   120 tablet   5   . levothyroxine (LEVOTHROID) 25 MCG tablet   Oral   Take 1 tablet (25 mcg total) by mouth daily.   30 tablet   3   . ondansetron (ZOFRAN) 4 MG tablet   Oral   Take 1 tablet (4 mg total) by mouth every 6 (six) hours.   12 tablet   0   . ondansetron (ZOFRAN) 8 MG tablet   Oral   Take 1 tablet (8 mg total) by mouth every 8 (eight) hours as needed for nausea.   15 tablet   0   . oxyCODONE-acetaminophen (ROXICET) 5-325 MG  per tablet   Oral   Take 1-2 tablets by mouth every 4 (four) hours as needed for severe pain.   30 tablet   0   . Prenatal Vit-Fe Fumarate-FA (PRENATAL MULTIVITAMIN) TABS   Oral   Take 1 tablet by mouth daily.         Marland Kitchen EXPIRED: ranitidine (ZANTAC) 150 MG tablet   Oral   Take 1 tablet (150 mg total) by mouth 2 (two) times daily.   180 tablet   1    BP 148/100  Pulse 102  Temp(Src) 98.4 F (36.9 C) (Oral)  Resp 18  SpO2 100% Physical Exam  Constitutional: She appears well-developed and well-nourished. No distress.  HENT:  Mouth/Throat: Uvula is midline, oropharynx is clear and moist and mucous membranes are normal. No trismus in the jaw. Abnormal dentition. Dental caries present. No dental abscesses.    Skin: She is not diaphoretic.    ED Course  Procedures (including critical care time) Labs Review Labs Reviewed - No data to display Imaging Review No results found.    MDM   1. Tooth fracture with loss of restorative material    Pain caused by exposed nerve. Given rx for percocet and will f/u with dentist.     Angelica Ran, MD 08/10/13 2103

## 2013-08-10 NOTE — ED Notes (Signed)
C/o toothache since ~12 n today ; minimal relief w oralgel, motrin, tylenol

## 2013-08-12 NOTE — ED Provider Notes (Signed)
Medical screening examination/treatment/procedure(s) were performed by resident physician or non-physician practitioner and as supervising physician I was immediately available for consultation/collaboration.   KINDL,JAMES DOUGLAS MD.   James D Kindl, MD 08/12/13 1228 

## 2013-09-05 ENCOUNTER — Other Ambulatory Visit: Payer: Self-pay | Admitting: Internal Medicine

## 2013-09-06 NOTE — Telephone Encounter (Signed)
faxed

## 2013-09-21 ENCOUNTER — Encounter: Payer: Self-pay | Admitting: Family Medicine

## 2013-09-21 ENCOUNTER — Other Ambulatory Visit: Payer: Self-pay | Admitting: Family Medicine

## 2013-09-21 ENCOUNTER — Ambulatory Visit (INDEPENDENT_AMBULATORY_CARE_PROVIDER_SITE_OTHER): Payer: 59 | Admitting: Family Medicine

## 2013-09-21 VITALS — BP 136/90 | HR 73 | Temp 98.9°F | Resp 16 | Ht 61.25 in | Wt 161.6 lb

## 2013-09-21 DIAGNOSIS — Z862 Personal history of diseases of the blood and blood-forming organs and certain disorders involving the immune mechanism: Secondary | ICD-10-CM

## 2013-09-21 DIAGNOSIS — IMO0001 Reserved for inherently not codable concepts without codable children: Secondary | ICD-10-CM

## 2013-09-21 DIAGNOSIS — G4700 Insomnia, unspecified: Secondary | ICD-10-CM

## 2013-09-21 DIAGNOSIS — Z8639 Personal history of other endocrine, nutritional and metabolic disease: Secondary | ICD-10-CM

## 2013-09-21 DIAGNOSIS — M797 Fibromyalgia: Secondary | ICD-10-CM

## 2013-09-21 DIAGNOSIS — D509 Iron deficiency anemia, unspecified: Secondary | ICD-10-CM

## 2013-09-21 LAB — CBC
HCT: 30.5 % — ABNORMAL LOW (ref 36.0–46.0)
Hemoglobin: 9.5 g/dL — ABNORMAL LOW (ref 12.0–15.0)
MCH: 20.6 pg — ABNORMAL LOW (ref 26.0–34.0)
MCHC: 31.1 g/dL (ref 30.0–36.0)
MCV: 66.2 fL — ABNORMAL LOW (ref 78.0–100.0)
Platelets: 396 10*3/uL (ref 150–400)
RBC: 4.61 MIL/uL (ref 3.87–5.11)
RDW: 17.3 % — ABNORMAL HIGH (ref 11.5–15.5)
WBC: 7.1 10*3/uL (ref 4.0–10.5)

## 2013-09-21 LAB — TSH: TSH: 0.852 u[IU]/mL (ref 0.350–4.500)

## 2013-09-21 MED ORDER — PANTOPRAZOLE SODIUM 40 MG PO TBEC: 40.0000 mg | DELAYED_RELEASE_TABLET | Freq: Every day | ORAL | Status: DC

## 2013-09-21 MED ORDER — TRAMADOL HCL 50 MG PO TABS: ORAL_TABLET | ORAL | Status: DC

## 2013-09-21 MED ORDER — ZOLPIDEM TARTRATE 5 MG PO TABS: 5.0000 mg | ORAL_TABLET | Freq: Every evening | ORAL | Status: DC | PRN

## 2013-09-21 NOTE — Progress Notes (Signed)
Chief Complaint:  Chief Complaint  Patient presents with  . 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 When she was here last she was prescribed amitryptiline to help her sleep but it did not work, she stopped taking it. She has been on many other meds before by her prior PCP Dr Birdie Riddle but she states they did not help ie melatonin, trazadone, she has never been on [place] before. 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  2. She has fibromyalgia. She wants refills for Tramadol x 1 month instead of 2 weeks since copay is high every 2 weeks and she cannot afford it. She has been on tramadol for the last 8 years and it has helped her tremendously. She states that she was seeing a chronic pain specialist but they stated that she did not need to see them since it was tramadol and she was well controlled on tramadol. Prior to that Dr Birdie Riddle was giving her pain meds and had referred her to a specialist Dr Kerry Dory.    Please see Dr Marlaine Hind note and also pain specialist, Dr Ephriam Knuckles   Dr Mardella Layman OV on 05/12/2012 This is a pleasant 33 yo WF referred here by Dr. Birdie Riddle, who was diagnosed with fibromyalgia about 7 years ago. She first realized something was wrong when she began to have pain and fatigue out of proportion to the level she might have expected. She was diagnosed by an urgent care md and eventually was treated by Dr. Harle Battiest. Most recently, Dr. Annye Asa has managed her pain.  Typically, she has generalized pain, ranging from her face to her feet. Often pain is worse on the left. She complains of headaches, too. Pain is worst when she wakes up and then when she is fatigued at the end of the day.  From a  treatment standpoint she uses heat, exercise including walking. She feels that the walking is very beneficial. Tramadol has helped, and she continues on it currently. She typically takes 6-8 pills (300-400mg  daily) on a q6hr basis. She has had more stress and a job change recently, and her pain has increased. She has tried cymbalta, Consulting civil engineer, neurontin which all either didn't work or had side effects. She also tried elavil which caused headache. She was started on wellbutrin 6 months ago for anxiety and pain. Klonopin was started at about the same time to help with sleep.  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 She has had difficulties with sleep as a result. Typically she sleeps for a few hours at a time during the day around her kids' schedules. She has 4 children from 64 to 21 years of age. Their father lives with them and helps to a certain extent.    Dr Wendee Beavers on 08/23/2013 Jenna Gray is a 33 y.o. female who presents to the [facility] for medication refill. She lives at home with her 4 children and husband. She works the 3rd shift at [employer].  She reports medicated for hypothyroidism, GERD and fibromyalgia which she takes synthroid, Protonix and  tramadol for. She reports was seeing Dr. Jorge Mandril for these medications but he has recently moved out of town from [facility]. She states the medicines have been working well for her and has approximately been medicated for thyroid disease for about 2 years. She has had GERD for a couple of years, she has been on protonix for past year which seems to be working better than omeprazole. She is also on tramadol for fibromyalgia.  She has not had a TSH level in the past year. She is unsure of her last cholesterol level.  She had an episode of anemia last year, unsure what was causing her anemia.  She reports with the sleep irregularities of 3rd shift, she is a very light sleeper and  has trouble falling back asleep if she is woken up. She denies trouble with snoring.  This past August, she had an episode of kidney stones. She also had kidney stones removed surgically years ago.    Past Medical History  Diagnosis Date  . History of ovarian cyst 02/2000    DERMOID, LEFT  . Migraine   . TMJ (temporomandibular joint syndrome)   . Fibromyalgia   . Anemia   . Right ureteral stone   . Hypothyroidism 05/2012  . Renal disorder    Past Surgical History  Procedure Laterality Date  . Cystectomy      OVARIAN DERMOID  . Appendectomy      AGE 51  . Tubal ligation  2010  . Lithotripsy     History   Social History  . Marital Status: Single    Spouse Name: N/A    Number of Children: N/A  . Years of Education: N/A   Social History Main Topics  . Smoking status: Never Smoker   . Smokeless tobacco: Never Used  . Alcohol Use: No  . Drug Use: No  . Sexual Activity: Yes    Partners: Male    Birth Control/ Protection: Surgical   Other Topics Concern  . None   Social History Narrative  . None   Family History  Problem Relation Age of Onset  . Hypertension Father   . Alcohol abuse Father   . Breast cancer Maternal Aunt   . Cancer Paternal Grandmother     COLON , PANCREATIC  . Hyperlipidemia Mother    Allergies  Allergen Reactions  . Penicillins Hives and Itching  . Neurontin [Gabapentin]     headache   Prior to Admission medications   Medication Sig Start Date End Date Taking? Authorizing Provider  ferrous sulfate 325 (65 FE) MG tablet Take 1 tablet by mouth 2 (two) times daily.   Yes Historical Provider, MD  pantoprazole (PROTONIX) 40 MG tablet Take 1 tablet (40 mg total) by mouth daily. 06/22/13  Yes Tonye Pearson, MD  traMADol (ULTRAM) 50 MG tablet TAKE ONE TO TWO TABLETS BY MOUTH EVERY 6 HOURS AS NEEDED 09/05/13  Yes Tonye Pearson, MD  amitriptyline (ELAVIL) 25 MG tablet Take 1 tablet (25 mg total) by mouth at bedtime. 06/22/13   Tonye Pearson, MD  ibuprofen (ADVIL,MOTRIN) 200 MG tablet Take 200-800 mg by mouth every 6 (six) hours as needed for pain. Headache or pain    Historical Provider, MD  levothyroxine (LEVOTHROID) 25 MCG tablet Take 1 tablet (25 mcg total) by mouth daily. 05/12/12   Ranelle Oyster, MD  ondansetron (ZOFRAN) 4 MG tablet Take 1 tablet (4 mg total) by mouth every 6 (six) hours. 02/18/13  Leota Jacobsen, MD  ondansetron (ZOFRAN) 8 MG tablet Take 1 tablet (8 mg total) by mouth every 8 (eight) hours as needed for nausea. 10/19/12   Gay Filler Copland, MD  oxyCODONE-acetaminophen (ROXICET) 5-325 MG per tablet Take 1-2 tablets by mouth every 4 (four) hours as needed for severe pain. 08/10/13   Angelica Ran, MD  Prenatal Vit-Fe Fumarate-FA (PRENATAL MULTIVITAMIN) TABS Take 1 tablet by mouth daily.    Historical Provider, MD  ranitidine (ZANTAC) 150 MG tablet Take 1 tablet (150 mg total) by mouth 2 (two) times daily. 02/22/12 02/21/13  Midge Minium, MD     ROS: The patient denies fevers, chills, night sweats, unintentional weight loss, chest pain, palpitations, wheezing, dyspnea on exertion, nausea, vomiting, abdominal pain, dysuria, hematuria, melena, numbness, weakness, or tingling.   All other systems have been reviewed and were otherwise negative with the exception of those mentioned in the HPI and as above.    PHYSICAL EXAM: Filed Vitals:   09/21/13 1011  BP: 136/90  Pulse: 73  Temp: 98.9 F (37.2 C)  Resp: 16   Filed Vitals:   09/21/13 1011  Height: 5' 1.25" (1.556 m)  Weight: 161 lb 9.6 oz (73.301 kg)   Body mass index is 30.28 kg/(m^2).  General: Alert, no acute distress HEENT:  Normocephalic, atraumatic, oropharynx patent. EOMI, PERRLA Cardiovascular:  Regular rate and rhythm, no rubs murmurs or gallops.  No Carotid bruits, radial pulse intact. No pedal edema.  Respiratory: Clear to auscultation bilaterally.  No wheezes, rales, or rhonchi.  No cyanosis, no use of accessory  musculature GI: No organomegaly, abdomen is soft and non-tender, positive bowel sounds.  No masses. Skin: No rashes. Neurologic: Facial musculature symmetric. Psychiatric: Patient is appropriate throughout our interaction. Lymphatic: No cervical lymphadenopathy Musculoskeletal: Gait intact.   LABS: Results for orders placed in visit on 09/21/13  CBC      Result Value Ref Range   WBC 7.1  4.0 - 10.5 K/uL   RBC 4.61  3.87 - 5.11 MIL/uL   Hemoglobin 9.5 (*) 12.0 - 15.0 g/dL   HCT 30.5 (*) 36.0 - 46.0 %   MCV 66.2 (*) 78.0 - 100.0 fL   MCH 20.6 (*) 26.0 - 34.0 pg   MCHC 31.1  30.0 - 36.0 g/dL   RDW 17.3 (*) 11.5 - 15.5 %   Platelets 396  150 - 400 K/uL  TSH      Result Value Ref Range   TSH 0.852  0.350 - 4.500 uIU/mL     EKG/XRAY:   Primary read interpreted by Dr. Marin Comment at [facility]   ASSESSMENT/PLAN: Encounter Diagnoses  Name Primary?  . Insomnia Yes  . Anemia, iron deficiency   . History of hypothyroidism   . Fibromyalgia    Recheck cbc, tsh, and iron studies prn if her levels are low Advise to take iron supplements Refill tramadol 240 , 1 refill F/u in 2 months with Dr Brigitte Pulse She knows that I do not refill meds if rx is lost. We talked about dependence and additiction potential and she denies all of that and any divergence issues .  Her narcotic profile was pulled and she ahs been getting pain meds primarily from [place] and also 1-2 time from the ER due to tooth pain. I will check with Dr Candise Che office to see what the story is about her chronic pain issues.   Gross sideeffects, risk and benefits, and alternatives of medications d/w patient. Patient is aware that all  medications have potential sideeffects and we are unable to predict every sideeffect or drug-drug interaction that may occur.  Leotis Pain, DO 09/26/2013 7:19 AM  Patient was called about her low anemia labs once they came in. She understands that she should return in 2 months after she takes her iron  supplements BID for recheck. The low hgb may be also why she is so tired.

## 2013-09-29 ENCOUNTER — Emergency Department (HOSPITAL_COMMUNITY)
Admission: EM | Admit: 2013-09-29 | Discharge: 2013-09-30 | Disposition: A | Discharge status: Home / Self Care | Payer: 59 | Attending: Emergency Medicine | Admitting: Emergency Medicine

## 2013-09-29 ENCOUNTER — Encounter (HOSPITAL_COMMUNITY): Payer: Self-pay | Admitting: Emergency Medicine

## 2013-09-29 DIAGNOSIS — R112 Nausea with vomiting, unspecified: Secondary | ICD-10-CM | POA: Insufficient documentation

## 2013-09-29 DIAGNOSIS — E039 Hypothyroidism, unspecified: Secondary | ICD-10-CM | POA: Insufficient documentation

## 2013-09-29 DIAGNOSIS — Z87442 Personal history of urinary calculi: Secondary | ICD-10-CM | POA: Insufficient documentation

## 2013-09-29 DIAGNOSIS — IMO0001 Reserved for inherently not codable concepts without codable children: Secondary | ICD-10-CM | POA: Insufficient documentation

## 2013-09-29 DIAGNOSIS — N200 Calculus of kidney: Secondary | ICD-10-CM | POA: Insufficient documentation

## 2013-09-29 DIAGNOSIS — Z79899 Other long term (current) drug therapy: Secondary | ICD-10-CM | POA: Insufficient documentation

## 2013-09-29 DIAGNOSIS — Z3202 Encounter for pregnancy test, result negative: Secondary | ICD-10-CM | POA: Insufficient documentation

## 2013-09-29 DIAGNOSIS — R319 Hematuria, unspecified: Secondary | ICD-10-CM | POA: Insufficient documentation

## 2013-09-29 DIAGNOSIS — D649 Anemia, unspecified: Secondary | ICD-10-CM | POA: Insufficient documentation

## 2013-09-29 DIAGNOSIS — Z880 Allergy status to penicillin: Secondary | ICD-10-CM | POA: Insufficient documentation

## 2013-09-29 DIAGNOSIS — Z8679 Personal history of other diseases of the circulatory system: Secondary | ICD-10-CM | POA: Insufficient documentation

## 2013-09-29 DIAGNOSIS — Z8742 Personal history of other diseases of the female genital tract: Secondary | ICD-10-CM | POA: Insufficient documentation

## 2013-09-29 LAB — URINALYSIS, ROUTINE W REFLEX MICROSCOPIC
Glucose, UA: NEGATIVE mg/dL
Ketones, ur: NEGATIVE mg/dL
NITRITE: NEGATIVE
PH: 5.5 (ref 5.0–8.0)
Protein, ur: 30 mg/dL — AB
SPECIFIC GRAVITY, URINE: 1.028 (ref 1.005–1.030)
UROBILINOGEN UA: 1 mg/dL (ref 0.0–1.0)

## 2013-09-29 LAB — COMPREHENSIVE METABOLIC PANEL
ALT: 11 U/L (ref 0–35)
AST: 16 U/L (ref 0–37)
Albumin: 3.8 g/dL (ref 3.5–5.2)
Alkaline Phosphatase: 66 U/L (ref 39–117)
BUN: 11 mg/dL (ref 6–23)
CALCIUM: 9.2 mg/dL (ref 8.4–10.5)
CO2: 24 meq/L (ref 19–32)
CREATININE: 1.01 mg/dL (ref 0.50–1.10)
Chloride: 102 mEq/L (ref 96–112)
GFR, EST AFRICAN AMERICAN: 83 mL/min — AB (ref >90)
GFR, EST NON AFRICAN AMERICAN: 72 mL/min — AB (ref >90)
GLUCOSE: 101 mg/dL — AB (ref 70–99)
Potassium: 4.1 mEq/L (ref 3.7–5.3)
SODIUM: 139 meq/L (ref 137–147)
TOTAL PROTEIN: 7.7 g/dL (ref 6.0–8.3)
Total Bilirubin: <0.2 mg/dL — ABNORMAL LOW (ref 0.3–1.2)

## 2013-09-29 LAB — CBC
HEMATOCRIT: 32.5 % — AB (ref 36.0–46.0)
HEMOGLOBIN: 9.9 g/dL — AB (ref 12.0–15.0)
MCH: 21.1 pg — AB (ref 26.0–34.0)
MCHC: 30.5 g/dL (ref 30.0–36.0)
MCV: 69.3 fL — AB (ref 78.0–100.0)
Platelets: 349 10*3/uL (ref 150–400)
RBC: 4.69 MIL/uL (ref 3.87–5.11)
RDW: 16.6 % — ABNORMAL HIGH (ref 11.5–15.5)
WBC: 11.1 10*3/uL — ABNORMAL HIGH (ref 4.0–10.5)

## 2013-09-29 LAB — URINE MICROSCOPIC-ADD ON

## 2013-09-29 LAB — PREGNANCY, URINE: PREG TEST UR: NEGATIVE

## 2013-09-29 MED ORDER — MORPHINE SULFATE 4 MG/ML IJ SOLN
4.0000 mg | Freq: Once | INTRAMUSCULAR | Status: AC
  Administered 2013-09-29: 4 mg via INTRAVENOUS
  Filled 2013-09-29: qty 1

## 2013-09-29 MED ORDER — HYDROMORPHONE HCL PF 1 MG/ML IJ SOLN: 0.5000 mg | Freq: Once | INTRAMUSCULAR | Status: DC

## 2013-09-29 MED ORDER — ONDANSETRON HCL 4 MG/2ML IJ SOLN
4.0000 mg | Freq: Once | INTRAMUSCULAR | Status: AC
  Administered 2013-09-29: 4 mg via INTRAVENOUS
  Filled 2013-09-29: qty 2

## 2013-09-29 MED ORDER — SODIUM CHLORIDE 0.9 % IV BOLUS (SEPSIS)
1000.0000 mL | Freq: Once | INTRAVENOUS | Status: AC
  Administered 2013-09-29: 1000 mL via INTRAVENOUS

## 2013-09-29 NOTE — ED Provider Notes (Signed)
CSN: [id number]     Arrival date & time 09/29/13  2027 History   First MD Initiated Contact with Patient 09/29/13 2146     Chief Complaint  Patient presents with  . Flank Pain     (Consider location/radiation/quality/duration/timing/severity/associated sxs/prior Treatment) The history is provided by the patient. No language interpreter was used.  Jenna Gray is a 33 year old female with past medical history of migraine, ovarian cysts, fibromyalgia, anemia, renal disorder, kidney stones presenting to the ED with right-sided flank pain that started late last night described as an aching sensation with worsening approximate 2 hours prior to arrival to the ED. Patient reports that the pain is localized to the right flank described as a dull aching sensation without radiation. Patient reported history of kidney stones and feels that she is trying to pass a stone. Reported that she's been having nausea with at least 2 episodes of emesis today-NB/NB. Reported that she noticed hematuria this afternoon. Patient stated that she has been using her Tramadol that she takes for her fibromyalgia with minimal relief - reported last dose to be at 2:00-3:00PM this afternoon. Stated that her last menstrual cycle was a few days ago. Patient reported that she's history of kidney stones with her last stone approximately 6 months ago and lithotripsy approximately 3 years ago - reported that she used to be followed up [facility]. Denied chest pain, shortness of breath, difficulty breathing, abdominal pain, melena, hematochezia, fever, chills. CBC Dr. Birdie Riddle   Past Medical History  Diagnosis Date  . History of ovarian cyst 02/2000    DERMOID, LEFT  . Migraine   . TMJ (temporomandibular joint syndrome)   . Fibromyalgia   . Anemia   . Right ureteral stone   . Hypothyroidism 05/2012  . Renal disorder    Past Surgical History  Procedure Laterality Date  . Cystectomy      OVARIAN DERMOID  . Appendectomy       AGE 16  . Tubal ligation  2010  . Lithotripsy     Family History  Problem Relation Age of Onset  . Hypertension Father   . Alcohol abuse Father   . Breast cancer Maternal Aunt   . Cancer Paternal Grandmother     COLON , PANCREATIC  . Hyperlipidemia Mother    History  Substance Use Topics  . Smoking status: Never Smoker   . Smokeless tobacco: Never Used  . Alcohol Use: No   OB History   Grav Para Term Preterm Abortions TAB SAB Ect Mult Living   4 4        4      Review of Systems  Constitutional: Negative for fever and chills.  Respiratory: Negative for chest tightness and shortness of breath.   Cardiovascular: Negative for chest pain.  Gastrointestinal: Positive for nausea and vomiting. Negative for abdominal pain, diarrhea, constipation and blood in stool.  Genitourinary: Positive for flank pain (Right-sided flank pain).  Musculoskeletal: Negative for back pain.  Neurological: Negative for dizziness and weakness.  All other systems reviewed and are negative.      Allergies  Penicillins and Neurontin  Home Medications   Current Outpatient Rx  Name  Route  Sig  Dispense  Refill  . ferrous sulfate 325 (65 FE) MG tablet   Oral   Take 1 tablet by mouth 2 (two) times daily.         Marland Kitchen ibuprofen (ADVIL,MOTRIN) 200 MG tablet   Oral   Take 200-800 mg by mouth  every 6 (six) hours as needed for pain. Headache or pain         . pantoprazole (PROTONIX) 40 MG tablet   Oral   Take 1 tablet (40 mg total) by mouth daily.   90 tablet   3   . traMADol (ULTRAM) 50 MG tablet      1-2 tabs po every 6 hours prn, maximum of 400 mg daily.  Do not take with ambien   240 tablet   1   . zolpidem (AMBIEN) 5 MG tablet   Oral   Take 1 tablet (5 mg total) by mouth at bedtime as needed for sleep.   30 tablet   1   . ciprofloxacin (CIPRO) 500 MG tablet   Oral   Take 1 tablet (500 mg total) by mouth 2 (two) times daily.   14 tablet   0   . ondansetron (ZOFRAN) 4 MG  tablet   Oral   Take 1 tablet (4 mg total) by mouth every 6 (six) hours.   12 tablet   0   . oxyCODONE-acetaminophen (PERCOCET/ROXICET) 5-325 MG per tablet   Oral   Take 1 tablet by mouth every 8 (eight) hours as needed for severe pain.   15 tablet   0   . Prenatal Vit-Fe Fumarate-FA (PRENATAL MULTIVITAMIN) TABS   Oral   Take 1 tablet by mouth daily.          BP 120/77  Pulse 78  Temp(Src) 98.3 F (36.8 C) (Oral)  Resp 19  Wt 161 lb (73.029 kg)  SpO2 99%  LMP 09/29/2013 Physical Exam  Nursing note and vitals reviewed. Constitutional: She is oriented to person, place, and time. She appears well-developed and well-nourished. No distress.  HENT:  Head: Normocephalic and atraumatic.  Mouth/Throat: Oropharynx is clear and moist. No oropharyngeal exudate.  Eyes: Conjunctivae and EOM are normal. Pupils are equal, round, and reactive to light. Right eye exhibits no discharge. Left eye exhibits no discharge.  Cardiovascular: Normal rate, regular rhythm and normal heart sounds.  Exam reveals no friction rub.   No murmur heard. Pulses:      Radial pulses are 2+ on the right side, and 2+ on the left side.       Dorsalis pedis pulses are 2+ on the right side, and 2+ on the left side.  Pulmonary/Chest: Effort normal and breath sounds normal. No respiratory distress. She has no wheezes. She has no rales.  Abdominal: Soft. Bowel sounds are normal. There is no tenderness. There is no guarding.  Negative abdominal distention Positive right-sided CVA tenderness  Musculoskeletal: Normal range of motion.  Full ROM to upper and lower extremities without difficulty noted, negative ataxia noted.  Neurological: She is alert and oriented to person, place, and time. No cranial nerve deficit. She exhibits normal muscle tone. Coordination normal.  Skin: Skin is warm and dry. No rash noted. She is not diaphoretic. No erythema.  Psychiatric: She has a normal mood and affect. Her behavior is normal.  Thought content normal.    ED Course  Procedures (including critical care time)  3:04 AM This provider spoke with Dr. Junious Silk, Urologist, discussed case, history, presentation, labs, imaging in great detail. Reported that since stone is less than 10 mm that each patient can be treated with a stone passage trial. Recommended patient to be given pain medications and to follow-up as outpatient.   3:16 AM Patient appears to be doing well. Patient reported that her pain has improved after  the medications. Discussed labs and imaging in great detail. Discussed plan for discharge.   Results for orders placed during the hospital encounter of 09/29/13  URINALYSIS, ROUTINE W REFLEX MICROSCOPIC      Result Value Ref Range   Color, Urine Ell[place] (*) YELLOW   APPearance CLOUDY (*) CLEAR   Specific Gravity, Urine 1.028  1.005 - 1.030   pH 5.5  5.0 - 8.0   Glucose, UA NEGATIVE  NEGATIVE mg/dL   Hgb urine dipstick LARGE (*) NEGATIVE   Bilirubin Urine SMALL (*) NEGATIVE   Ketones, ur NEGATIVE  NEGATIVE mg/dL   Protein, ur 30 (*) NEGATIVE mg/dL   Urobilinogen, UA 1.0  0.0 - 1.0 mg/dL   Nitrite NEGATIVE  NEGATIVE   Leukocytes, UA SMALL (*) NEGATIVE  PREGNANCY, URINE      Result Value Ref Range   Preg Test, Ur NEGATIVE  NEGATIVE  URINE MICROSCOPIC-ADD ON      Result Value Ref Range   Squamous Epithelial / LPF MANY (*) RARE   WBC, UA 11-20  <3 WBC/hpf   RBC / HPF TOO NUMEROUS TO COUNT  <3 RBC/hpf   Bacteria, UA MANY (*) RARE   Crystals CA OXALATE CRYSTALS (*) NEGATIVE   Urine-Other MUCOUS PRESENT    CBC      Result Value Ref Range   WBC 11.1 (*) 4.0 - 10.5 K/uL   RBC 4.69  3.87 - 5.11 MIL/uL   Hemoglobin 9.9 (*) 12.0 - 15.0 g/dL   HCT 32.5 (*) 36.0 - 46.0 %   MCV 69.3 (*) 78.0 - 100.0 fL   MCH 21.1 (*) 26.0 - 34.0 pg   MCHC 30.5  30.0 - 36.0 g/dL   RDW 16.6 (*) 11.5 - 15.5 %   Platelets 349  150 - 400 K/uL  COMPREHENSIVE METABOLIC PANEL      Result Value Ref Range   Sodium 139  137 - 147  mEq/L   Potassium 4.1  3.7 - 5.3 mEq/L   Chloride 102  96 - 112 mEq/L   CO2 24  19 - 32 mEq/L   Glucose, Bld 101 (*) 70 - 99 mg/dL   BUN 11  6 - 23 mg/dL   Creatinine, Ser 1.01  0.50 - 1.10 mg/dL   Calcium 9.2  8.4 - 10.5 mg/dL   Total Protein 7.7  6.0 - 8.3 g/dL   Albumin 3.8  3.5 - 5.2 g/dL   AST 16  0 - 37 U/L   ALT 11  0 - 35 U/L   Alkaline Phosphatase 66  39 - 117 U/L   Total Bilirubin <0.2 (*) 0.3 - 1.2 mg/dL   GFR calc non Af Amer 72 (*) >90 mL/min   GFR calc Af Amer 83 (*) >90 mL/min    Labs Review Labs Reviewed  URINALYSIS, ROUTINE W REFLEX MICROSCOPIC - Abnormal; Notable for the following:    Color, Urine Cristie (*)    APPearance CLOUDY (*)    Hgb urine dipstick LARGE (*)    Bilirubin Urine SMALL (*)    Protein, ur 30 (*)    Leukocytes, UA SMALL (*)    All other components within normal limits  URINE MICROSCOPIC-ADD ON - Abnormal; Notable for the following:    Squamous Epithelial / LPF MANY (*)    Bacteria, UA MANY (*)    Crystals CA OXALATE CRYSTALS (*)    All other components within normal limits  CBC - Abnormal; Notable for the following:    WBC  11.1 (*)    Hemoglobin 9.9 (*)    HCT 32.5 (*)    MCV 69.3 (*)    MCH 21.1 (*)    RDW 16.6 (*)    All other components within normal limits  COMPREHENSIVE METABOLIC PANEL - Abnormal; Notable for the following:    Glucose, Bld 101 (*)    Total Bilirubin <0.2 (*)    GFR calc non Af Amer 72 (*)    GFR calc Af Amer 83 (*)    All other components within normal limits  PREGNANCY, URINE   Imaging Review Ct Abdomen Pelvis Wo Contrast  09/30/2013   CLINICAL DATA:  Right flank pain  EXAM: CT ABDOMEN AND PELVIS WITHOUT CONTRAST  TECHNIQUE: Multidetector CT imaging of the abdomen and pelvis was performed following the standard protocol without intravenous contrast.  COMPARISON:  DG ABDOMEN 1V dated 09/01/2012; CT ABD/PELV WO CM dated 08/23/2012  FINDINGS: Renal: There is right hydronephrosis and hydroureter secondary to an  obstructing calculus in the distal right ureter measuring 3 mm (image 77, series 2). This is approximately 1 cm from the vesicoureteral junction. One small nonobstructing calculus in the left kidney measuring 2 mm. No ureterolithiasis on the left.  Lung bases are clear. No pericardial fluid. No focal hepatic lesions non contrast exam. The pancreas, spleen, adrenal glands are normal.  The stomach, small bowel and colon are unremarkable. Abdominal aorta is normal. Uterus and ovaries are normal. No aggressive osseous lesion.  IMPRESSION: 1. Obstructing calculus in the distal right ureter with moderate hydronephrosis and hydroureter. 2. Nonobstructing left nephrolithiasis.   Electronically Signed   By: Suzy Bouchard M.D.   On: 09/30/2013 02:08     EKG Interpretation None      MDM   Final diagnoses:  Nephrolithiasis   Medications  ondansetron (ZOFRAN) injection 4 mg (4 mg Intravenous Given 09/29/13 2321)  sodium chloride 0.9 % bolus 1,000 mL (0 mLs Intravenous Stopped 09/30/13 0019)  morphine 4 MG/ML injection 4 mg (4 mg Intravenous Given 09/29/13 2321)  HYDROmorphone (DILAUDID) injection 1 mg (1 mg Intravenous Given 09/30/13 0138)   Filed Vitals:   09/29/13 2031 09/30/13 0001  BP: 144/96 120/77  Pulse: 87 78  Temp: 98.9 F (37.2 C) 98.3 F (36.8 C)  TempSrc: Oral Oral  Resp: 20 19  Weight: 161 lb (73.029 kg)   SpO2: 95% 99%    Patient presenting to the ED with right sided flank pain that started last night described as an aching sensation with worsening within the past 2 hours prior to arrival to the ED. Patient reported that the pain is described as a dull, aching sensation that is constant without radiation. Patient reported that she has been feeling nauseous with episodes of emesis x 2 - NB/NB. Reported that she has history of kidney stones - reported last stone was 6 month ago. Reported that she has been taking Tramadol with minimal relief - last dose at 2:00-3:00PM.  Alert and  oriented. GCS 15. Heart rate and rhythm normal. Lungs clear to auscultation to upper and lower lobes bilaterally. Radial and DP pulses 2+ bilaterally. Cap refill less than 3 seconds. Negative abdominal distention-bowel sounds normal active in all 4 quadrants, soft upon palpation-benign abdominal exam. Positive right-sided CVA tenderness. CBC noted mild elevated white blood cell count of 11.1. CMP negative findings his liver and kidney within normal levels. Urine pregnancy negative. Urinalysis noted large hemoglobin with small leukocytes with pyuria 11-20, positive oxalate crystals noted in urine. CT abdomen  and pelvis without contrast noted right hydronephrosis and hydroureter secondary to obstructing calculus in the distal right ureter measuring 3 mm approximately 1 cm from the vesicoureteral junction.  Nephrolithiasis with a stone in the right ureter measuring approximately 3 mm with hydronephrosis noted. Mildly elevated white cell count of 11.1-negative left shift or leukocytosis-patient is not febrile or hypotensive. Negative findings of sepsis. Pain medications administered in ED setting relieved pain. This provider spoke with urology who recommended patient to be discharged with stone passage trial. Patient stable, afebrile. Patient not septic appearing. Discharged patient with small dose of pain medications discussed course, precautions, disposal technique. Discharged patient with antiemetics and antibiotics. Discussed with patient to rest and stay hydrated. Urine strainer given. Referred patient to primary care provider and urology. Discussed with patient to closely monitor symptoms and if symptoms are to worsen or change to report back to the ED - strict return instructions given.  Patient agreed to plan of care, understood, all questions answered.   Jamse Mead, PA-C 09/30/13 1015

## 2013-09-29 NOTE — ED Notes (Signed)
Pt arrived to the ED with a compliant of right sided flank pain.  Pt has a hx of kidney stones and she feels as if it is a similar feeling.  Pt states the pain is a dull ache.  Pt has had the pain for 24 hours with and increase in intensity in the last hour and a half.

## 2013-09-30 ENCOUNTER — Emergency Department (HOSPITAL_COMMUNITY): Payer: 59

## 2013-09-30 MED ORDER — HYDROMORPHONE HCL PF 1 MG/ML IJ SOLN: 0.5000 mg | Freq: Once | INTRAMUSCULAR | Status: DC

## 2013-09-30 MED ORDER — OXYCODONE-ACETAMINOPHEN 5-325 MG PO TABS: 1.0000 | ORAL_TABLET | Freq: Three times a day (TID) | ORAL | Status: DC | PRN

## 2013-09-30 MED ORDER — HYDROMORPHONE HCL PF 1 MG/ML IJ SOLN
1.0000 mg | Freq: Once | INTRAMUSCULAR | Status: AC
  Administered 2013-09-30: 1 mg via INTRAVENOUS
  Filled 2013-09-30: qty 1

## 2013-09-30 MED ORDER — CIPROFLOXACIN HCL 500 MG PO TABS: 500.0000 mg | ORAL_TABLET | Freq: Two times a day (BID) | ORAL | Status: DC

## 2013-09-30 MED ORDER — ONDANSETRON HCL 4 MG PO TABS: 4.0000 mg | ORAL_TABLET | Freq: Four times a day (QID) | ORAL | Status: DC

## 2013-09-30 NOTE — Discharge Instructions (Signed)
Please call your doctor for a followup appointment within 24-48 hours. When you talk to your doctor please let them know that you were seen in the emergency department and have them acquire all of your records so that they can discuss the findings with you and formulate a treatment plan to fully care for your new and ongoing problems. Please call and set-up an appointment with Urology  Please rest and stay hydrated - please drink plenty of fluids Please take pain medications as prescribed - while on pain medications there is to be no drinking alcohol, driving, operating any machinery. If there is extra please dispose in a proper manner. Please don't take extra Tylenol for this can lead to Tylenol overdose and liver issues. Please strain urine with each urination Please continue monitor symptoms closely if symptoms are to worsen or change (fever greater than 101, chills, neck pain, chest pain, shortness breath, difficulty breathing, changes or worsening symptoms, abdominal pain, blood in stool, black stool, pus in urine) please report back to the ED immediately   Kidney Stones Kidney stones (urolithiasis) are solid masses that form inside your kidneys. The intense pain is caused by the stone moving through the kidney, ureter, bladder, and urethra (urinary tract). When the stone moves, the ureter starts to spasm around the stone. The stone is usually passed in your pee (urine).  HOME CARE  Drink enough fluids to keep your pee clear or pale yellow. This helps to get the stone out.  Strain all pee through the provided strainer. Do not pee without peeing through the strainer, not even once. If you pee the stone out, catch it in the strainer. The stone may be as small as a grain of salt. Take this to your doctor. This will help your doctor figure out what you can do to try to prevent more kidney stones.  Only take medicine as told by your doctor.  Follow up with your doctor as told.  Get follow-up  X-rays as told by your doctor. GET HELP IF: You have pain that gets worse even if you have been taking pain medicine. GET HELP RIGHT AWAY IF:   Your pain does not get better with medicine.  You have a fever or shaking chills.  Your pain increases and gets worse over 18 hours.  You have new belly (abdominal) pain.  You feel faint or pass out.  You are unable to pee. MAKE SURE YOU:   Understand these instructions.  Will watch your condition.  Will get help right away if you are not doing well or get worse. Document Released: 12/15/2007 Document Revised: 02/28/2013 Document Reviewed: 11/29/2012 [facility] Patient Information 2014 [place], [place].  Diet for Kidney Stones Kidney stones are small, hard masses that form inside your kidneys. They are made up of salts and minerals and often form when high levels build up in the urine. The minerals can then start to build up, crystalize, and stick together to form stones. There are several different types of kidney stones. The following types of stones may be influenced by dietary factors:   Calcium Oxalate Stones. An oxalate is a salt found in certain foods. Within the body, calcium can combine with oxalates to form calcium oxalate stones, which can be excreted in the urine in high amounts. This is the most common type of kidney stone.  Calcium Phosphate Stones. These stones may occur when the pH of the urine becomes too high, or less acidic, from too much calcium being excreted  in the urine. The pH is a measure of how acidic or basic a substance is.  Uric Acid Stones. This type of stone occurs when the pH of the urine becomes too low, or very acidic, because substances called purines build up in the urine. Purines are found in animal proteins. When the urine is highly concentrated with acid, uric acid kidney stones can form.  Other risk factors for kidney stones include genetics, environment, and being overweight. Your caregiver may ask you  to follow specific diet guidelines based on the type of stone you have to lessen the chances of your body making more kidney stones.  GENERAL GUIDELINES FOR ALL TYPES OF STONES  Drink plenty of fluid. Drink 12 16 cups of fluid a day, drinking mainly water.This is the most important thing you can do to prevent the formation of future kidney stones.  Maintain a healthy weight. Your caregiver or dietitian can help you determine what a healthy weight is for you. If you are overweight, weight loss may help prevent the formation of future kidney stones.  Eat a diet adequate in animal protein. Too much animal protein can contribute to the formation of stones. Your dietitian can help you determine how much protein you should be eating. Avoid low carbohydrate, high protein diets.  Follow a balanced eating approach. The DASH diet, which stands for "Dietary Approaches to Stop Hypertension," is an effective meal plan for reducing stone formation. This diet is high in fruits, vegetables, dairy, and whole grains and low in animal protein. Ask your caregiver or dietitian for information about the DASH diet. ADDITIONAL DIET GUIDELINES FOR CALCIUM STONES Avoid foods high in salt. This includes table salt, salt seasonings, MSG, soy sauce, cured and processed meats, salted crackers and snack foods, fast food, and canned soups and foods. Ask your caregiver or dietitian for information about reducing sodium in your diet or following the low sodium diet.  Ensure adequate calcium intake. Use the following table for calcium guidelines:  Men 52 years old and younger  1000 mg/day.  Men 50 years old and older  1500 mg/day.  Women 48 35 years old  1000 mg/day.  Women 50 years and older  1500 mg/day. Your dietitian can help you determine if you are getting enough calcium in your diet. Foods that are high in calcium include dairy products, broccoli, cheese, yogurt, and pudding. If you need to take a calcium supplement, take  it only in the form of calcium citrate.  Avoid foods high in oxalate. Be sure that any supplements you take do not contain more than 500 mg of vitamin C. Vitamin C is converted into oxalate in the body. You do not need to avoid fruits and vegetables high in vitamin C.   Grains: High-fiber or bran cereal, whole-wheat bread, grits, barley, buckwheat, amaranth, pretzels, and fruitcake.  Vegetables: Dried beans, wax beans, dark leafy greens, eggplant, leeks, okra, parsley, rutabaga, tomato paste, watercress, zucchini, and escarole.  Fruit: Dried apricots, red currants, figs, kiwi, and rhubarb.  Meat and Meat Substitutes: Soybeans and foods made from soy (soyburger, miso), dried beans, peanut butter.  Milk: Chocolate milk mixes and soymilk.  Fats and Oils: Nuts (peanuts, almonds, pecans, cashews, hazelnuts) and nut butters, sesame seeds, and tDahini paste.  Condiments/Miscellaneous: Chocolate, carob, marmalade, poppy seeds, instant iced tea, and juice from high-oxalate fruits.  Document Released: 10/23/2010 Document Revised: 12/28/2011 Document Reviewed: 12/13/2011 [facility] Patient Information 2014 [place].

## 2013-09-30 NOTE — ED Provider Notes (Signed)
Medical screening examination/treatment/procedure(s) were performed by non-physician practitioner and as supervising physician I was immediately available for consultation/collaboration.    Neta Ehlers, MD 09/30/13 1140

## 2013-11-07 ENCOUNTER — Ambulatory Visit (INDEPENDENT_AMBULATORY_CARE_PROVIDER_SITE_OTHER): Payer: 59 | Admitting: Family Medicine

## 2013-11-07 VITALS — BP 140/88 | HR 78 | Temp 98.4°F | Resp 16 | Ht 60.5 in | Wt 152.0 lb

## 2013-11-07 DIAGNOSIS — R102 Pelvic and perineal pain: Secondary | ICD-10-CM

## 2013-11-07 DIAGNOSIS — R109 Unspecified abdominal pain: Secondary | ICD-10-CM

## 2013-11-07 DIAGNOSIS — M797 Fibromyalgia: Secondary | ICD-10-CM

## 2013-11-07 DIAGNOSIS — IMO0001 Reserved for inherently not codable concepts without codable children: Secondary | ICD-10-CM

## 2013-11-07 DIAGNOSIS — N949 Unspecified condition associated with female genital organs and menstrual cycle: Secondary | ICD-10-CM

## 2013-11-07 LAB — POCT CBC
Granulocyte percent: 58.5 %G (ref 37–80)
HCT, POC: 32.2 % — AB (ref 37.7–47.9)
HEMOGLOBIN: 10 g/dL — AB (ref 12.2–16.2)
Lymph, poc: 2.8 (ref 0.6–3.4)
MCH: 21.1 pg — AB (ref 27–31.2)
MCHC: 31.1 g/dL — AB (ref 31.8–35.4)
MCV: 68 fL — AB (ref 80–97)
MID (CBC): 0.5 (ref 0–0.9)
MPV: 10.4 fL (ref 0–99.8)
POC Granulocyte: 4.7 (ref 2–6.9)
POC LYMPH PERCENT: 35.4 %L (ref 10–50)
POC MID %: 6.1 %M (ref 0–12)
Platelet Count, POC: 395 10*3/uL (ref 142–424)
RBC: 4.73 M/uL (ref 4.04–5.48)
RDW, POC: 18 %
WBC: 8 10*3/uL (ref 4.6–10.2)

## 2013-11-07 LAB — POCT UA - MICROSCOPIC ONLY
BACTERIA, U MICROSCOPIC: NEGATIVE
Casts, Ur, LPF, POC: NEGATIVE
Crystals, Ur, HPF, POC: NEGATIVE
Mucus, UA: NEGATIVE
RBC, URINE, MICROSCOPIC: NEGATIVE
Yeast, UA: NEGATIVE

## 2013-11-07 LAB — POCT URINALYSIS DIPSTICK
Bilirubin, UA: NEGATIVE
Blood, UA: NEGATIVE
Glucose, UA: NEGATIVE
KETONES UA: NEGATIVE
Leukocytes, UA: NEGATIVE
Nitrite, UA: NEGATIVE
Protein, UA: NEGATIVE
SPEC GRAV UA: 1.02
UROBILINOGEN UA: 0.2
pH, UA: 6

## 2013-11-07 MED ORDER — DICLOFENAC SODIUM 75 MG PO TBEC: 75.0000 mg | DELAYED_RELEASE_TABLET | Freq: Two times a day (BID) | ORAL | Status: DC

## 2013-11-07 MED ORDER — TRAMADOL HCL 50 MG PO TABS: ORAL_TABLET | ORAL | Status: DC

## 2013-11-07 NOTE — Patient Instructions (Addendum)
Take her diclofenac one pill twice daily for pain and inflammation in pelvis. If it upsets her stomach, discontinue it and contact us for further suggestions. If he persists with having pain in the same area I think will need to send you back to the urologist for further evaluation. This acts like some kind of a post kidney stone inflammation in that area of the pelvis. You might are having some kind of a cystoscopy to further assess this. Drink plenty of fluids and take the medicine with meals  You can take the tramadol and/or Tylenol in addition to the prescribed medication as above. However, do not take ibuprofen or Aleve type products while on the diclofenac.  Try to not exceed maximum of 3000 mg of tylenol on a daily basis, less is preferred to protect liver.  Return if worse or not improving  Make an appointment to see Dr. Brigitte Pulse.  [facility] Policy for Prescribing Controlled Substances (Revised 05/2012) 1. Prescriptions for controlled substances will be filled by ONE provider at [facility] with whom you have established and developed a plan for your care, including follow-up. 2. You are encouraged to schedule an appointment with your prescriber at our appointment center for follow-up visits whenever possible. 3. If you request a prescription for the controlled substance while at [facility] for an acute problem (with someone other than your regular prescriber), you MAY be given a ONE-TIME prescription for a 30-day supply of the controlled substance, to allow time for you to return to see your regular prescriber for additional prescriptions.

## 2013-11-07 NOTE — Progress Notes (Signed)
Subjective: Since her last visit here the patient has seen a gynecologist who did an ultrasound and the pelvis looked okay. She also has seen a urologist at [facility] urology, had evaluation with another CT scan, has been shown to have hard and fast her kidney stone which was about 2 mm in diameter (does not know when it passed) and yet continues to hurt in her right lower quadrant. 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 He is in hurting sometimes in her right low back mostly in the right pelvic region. No urinary symptoms. Her last menstrual cycle was about 2 weeks ago. She has had her tubes tied, and is not pregnant.  Objective: No major distress but is having some pain. No CVA tenderness today. Her abdomen has normal bowel sounds. There is a scar in the right lower quadrant from appendectomy years ago. Abdomen soft without organomegaly or masses. She is tender in the right lower quadrant just below McBurney point area, closer to the ovarian area. In the mid-bimanual was performed and there are no masses. There is a little pain in the left adnexa little tenderness with movement of the cervix, but most tenderness is with deep palpation in the right vault. No masses could be appreciated. Uterus and ovaries seem normal.  Assessment: Right lower quadrant and flank pain History of recent kidney stone History of iron deficiency anemia  Plan: Urinalysis and CBC  The urine does not show any evidence of bleeding. It is my impression that she is probably just having some kind of inflammation still in the distal ureter where she had the stone for a while. I do not find anything else to explain the pain. We'll check a CBC, but probably will want to just treat with nonsteroidals if her stomach can tolerate them and see if coming down inflammation will resolve the pain.  Results for orders placed in visit on 11/07/13  POCT UA - MICROSCOPIC ONLY      Result Value  Ref Range   WBC, Ur, HPF, POC 0-1     RBC, urine, microscopic neg     Bacteria, U Microscopic neg     Mucus, UA neg     Epithelial cells, urine per micros 0-3     Crystals, Ur, HPF, POC neg     Casts, Ur, LPF, POC neg     Yeast, UA neg    POCT URINALYSIS DIPSTICK      Result Value Ref Range   Color, UA yellow     Clarity, UA clear     Glucose, UA neg     Bilirubin, UA neg     Ketones, UA neg     Spec Grav, UA 1.020     Blood, UA neg     pH, UA 6.0     Protein, UA neg     Urobilinogen, UA 0.2     Nitrite, UA neg     Leukocytes, UA Negative    POCT CBC      Result Value Ref Range   WBC 8.0  4.6 - 10.2 K/uL   Lymph, poc 2.8  0.6 - 3.4   POC LYMPH PERCENT 35.4  10 - 50 %L   MID (cbc) 0.5  0 - 0.9   POC MID % 6.1  0 - 12 %M   POC Granulocyte 4.7  2 - 6.9   Granulocyte percent 58.5  37 - 80 %G  RBC 4.73  4.04 - 5.48 M/uL   Hemoglobin 10.0 (*) 12.2 - 16.2 g/dL   HCT, POC 32.2 (*) 37.7 - 47.9 %   MCV 68.0 (*) 80 - 97 fL   MCH, POC 21.1 (*) 27 - 31.2 pg   MCHC 31.1 (*) 31.8 - 35.4 g/dL   RDW, POC 18.0     Platelet Count, POC 395  142 - 424 K/uL   MPV 10.4  0 - 99.8 fL

## 2013-11-15 ENCOUNTER — Other Ambulatory Visit (HOSPITAL_COMMUNITY)
Admission: RE | Admit: 2013-11-15 | Discharge: 2013-11-15 | Disposition: A | Discharge status: Home / Self Care | Payer: 59 | Source: Ambulatory Visit | Attending: Women's Health | Admitting: Women's Health

## 2013-11-15 ENCOUNTER — Ambulatory Visit (INDEPENDENT_AMBULATORY_CARE_PROVIDER_SITE_OTHER): Payer: 59 | Admitting: Women's Health

## 2013-11-15 ENCOUNTER — Encounter: Payer: Self-pay | Admitting: Women's Health

## 2013-11-15 VITALS — BP 140/92 | Ht 61.0 in | Wt 158.0 lb

## 2013-11-15 DIAGNOSIS — N920 Excessive and frequent menstruation with regular cycle: Secondary | ICD-10-CM

## 2013-11-15 DIAGNOSIS — Z1151 Encounter for screening for human papillomavirus (HPV): Secondary | ICD-10-CM | POA: Insufficient documentation

## 2013-11-15 DIAGNOSIS — Z01419 Encounter for gynecological examination (general) (routine) without abnormal findings: Secondary | ICD-10-CM | POA: Insufficient documentation

## 2013-11-15 NOTE — Patient Instructions (Signed)

## 2013-11-15 NOTE — Progress Notes (Signed)
Jenna Gray 05/02/1981 [id number]    History:    Presents for annual exam.  Monthly cycle with bleeding prior to cycle starting and after cycle  7 days of heavy bleeding with clots cycles lasting 2 weeks. BTL. Normal Pap history. Iron deficient anemia treated by primary care. History of hypothyroidism  normal TSH now. History of kidney stones, fibromyalgia and headaches.  Past medical history, past surgical history, family history and social history were all reviewed and documented in the EPIC chart. 2001 dermoid removed. Works at [employer], night shift office work. 4 sons 42, 77, 34, 5 all doing well.  Father hypertension, mother hypercholesterolemia.  ROS:  A  12 point ROS was performed and pertinent positives and negatives are included.  Exam:  Filed Vitals:   11/15/13 0829  BP: 140/92    General appearance:  Normal Thyroid:  Symmetrical, normal in size, without palpable masses or nodularity. Respiratory  Auscultation:  Clear without wheezing or rhonchi Cardiovascular  Auscultation:  Regular rate, without rubs, murmurs or gallops  Edema/varicosities:  Not grossly evident Abdominal  Soft,nontender, without masses, guarding or rebound.  Liver/spleen:  No organomegaly noted  Hernia:  None appreciated  Skin  Inspection:  Grossly normal   Breasts: Examined lying and sitting.     Right: Without masses, retractions, discharge or axillary adenopathy.     Left: Without masses, retractions, discharge or axillary adenopathy. Gentitourinary   Inguinal/mons:  Normal without inguinal adenopathy  External genitalia:  Normal  BUS/Urethra/Skene's glands:  Normal  Vagina:  Normal  Cervix:  Normal  Uterus:   normal in size, shape and contour.  Midline and mobile  Adnexa/parametria:     Rt: Without masses or tenderness.   Lt: Without masses or tenderness.  Anus and perineum: Normal  Digital rectal exam: Normal sphincter tone without palpated masses or tenderness  Assessment/Plan:  33  y.o. MWF G4P4 for annual exam.    Menorrhagia with DUB (spots days prior to cycle and after 7 day heavy flow/bleeds 2 weeks) BTL Iron deficient anemia/Fibromyalgia/headache/kidney stones-primary care labs and meds  Plan: Sonohysterogram schedule with Dr. Toney Rakes after next cycle. Discussed her option, handout given and reviewed, may help with both menorrhagia and anemia. SBE's, exercise,, continue iron supplements and increase iron and calcium rich foods in diet. Pap with HR HPV typing. Pap normal 2012, new screening guidelines reviewed.     [facility], 9:13 AM 11/15/2013

## 2013-11-15 NOTE — Addendum Note (Signed)
Addended by: Alen Blew on: 11/15/2013 12:28 PM   Modules accepted: Orders

## 2013-11-29 ENCOUNTER — Other Ambulatory Visit: Payer: Self-pay | Admitting: Family Medicine

## 2013-11-30 NOTE — Telephone Encounter (Signed)
Faxed

## 2013-12-11 ENCOUNTER — Other Ambulatory Visit: Payer: Self-pay | Admitting: Gynecology

## 2013-12-11 ENCOUNTER — Other Ambulatory Visit: Payer: Self-pay | Admitting: Women's Health

## 2013-12-11 DIAGNOSIS — N949 Unspecified condition associated with female genital organs and menstrual cycle: Secondary | ICD-10-CM

## 2013-12-11 DIAGNOSIS — N920 Excessive and frequent menstruation with regular cycle: Secondary | ICD-10-CM

## 2013-12-11 DIAGNOSIS — N938 Other specified abnormal uterine and vaginal bleeding: Secondary | ICD-10-CM

## 2013-12-14 ENCOUNTER — Encounter: Payer: Self-pay | Admitting: Family Medicine

## 2013-12-14 ENCOUNTER — Ambulatory Visit (INDEPENDENT_AMBULATORY_CARE_PROVIDER_SITE_OTHER): Payer: 59 | Admitting: Gynecology

## 2013-12-14 ENCOUNTER — Ambulatory Visit (INDEPENDENT_AMBULATORY_CARE_PROVIDER_SITE_OTHER): Payer: 59

## 2013-12-14 ENCOUNTER — Ambulatory Visit (INDEPENDENT_AMBULATORY_CARE_PROVIDER_SITE_OTHER): Payer: 59 | Admitting: Family Medicine

## 2013-12-14 VITALS — BP 139/98 | HR 90 | Temp 99.1°F | Resp 18 | Ht 61.0 in | Wt 155.0 lb

## 2013-12-14 DIAGNOSIS — N938 Other specified abnormal uterine and vaginal bleeding: Secondary | ICD-10-CM

## 2013-12-14 DIAGNOSIS — E559 Vitamin D deficiency, unspecified: Secondary | ICD-10-CM

## 2013-12-14 DIAGNOSIS — N920 Excessive and frequent menstruation with regular cycle: Secondary | ICD-10-CM

## 2013-12-14 DIAGNOSIS — M797 Fibromyalgia: Secondary | ICD-10-CM

## 2013-12-14 DIAGNOSIS — N949 Unspecified condition associated with female genital organs and menstrual cycle: Secondary | ICD-10-CM

## 2013-12-14 DIAGNOSIS — D649 Anemia, unspecified: Secondary | ICD-10-CM

## 2013-12-14 DIAGNOSIS — K5900 Constipation, unspecified: Secondary | ICD-10-CM

## 2013-12-14 DIAGNOSIS — K219 Gastro-esophageal reflux disease without esophagitis: Secondary | ICD-10-CM

## 2013-12-14 DIAGNOSIS — G4726 Circadian rhythm sleep disorder, shift work type: Secondary | ICD-10-CM

## 2013-12-14 DIAGNOSIS — IMO0001 Reserved for inherently not codable concepts without codable children: Secondary | ICD-10-CM

## 2013-12-14 DIAGNOSIS — E039 Hypothyroidism, unspecified: Secondary | ICD-10-CM

## 2013-12-14 DIAGNOSIS — D589 Hereditary hemolytic anemia, unspecified: Secondary | ICD-10-CM

## 2013-12-14 DIAGNOSIS — D259 Leiomyoma of uterus, unspecified: Secondary | ICD-10-CM

## 2013-12-14 DIAGNOSIS — D509 Iron deficiency anemia, unspecified: Secondary | ICD-10-CM

## 2013-12-14 LAB — LIPID PANEL
CHOL/HDL RATIO: 4.7 ratio
Cholesterol: 222 mg/dL — ABNORMAL HIGH (ref 0–200)
HDL: 47 mg/dL (ref >39)
LDL CALC: 153 mg/dL — AB (ref 0–99)
Triglycerides: 108 mg/dL (ref <150)
VLDL: 22 mg/dL (ref 0–40)

## 2013-12-14 LAB — FERRITIN: FERRITIN: 1 ng/mL — AB (ref 10–291)

## 2013-12-14 LAB — CBC
HCT: 32.6 % — ABNORMAL LOW (ref 36.0–46.0)
Hemoglobin: 10.2 g/dL — ABNORMAL LOW (ref 12.0–15.0)
MCH: 20.2 pg — ABNORMAL LOW (ref 26.0–34.0)
MCHC: 31.3 g/dL (ref 30.0–36.0)
MCV: 64.7 fL — ABNORMAL LOW (ref 78.0–100.0)
PLATELETS: 355 10*3/uL (ref 150–400)
RBC: 5.04 MIL/uL (ref 3.87–5.11)
RDW: 16.6 % — AB (ref 11.5–15.5)
WBC: 8 10*3/uL (ref 4.0–10.5)

## 2013-12-14 LAB — IBC PANEL
%SAT: 3 % — ABNORMAL LOW (ref 20–55)
TIBC: 438 ug/dL (ref 250–470)
UIBC: 424 ug/dL — ABNORMAL HIGH (ref 125–400)

## 2013-12-14 LAB — IRON: Iron: 14 ug/dL — ABNORMAL LOW (ref 42–145)

## 2013-12-14 MED ORDER — ZOLPIDEM TARTRATE 5 MG PO TABS: 5.0000 mg | ORAL_TABLET | Freq: Every evening | ORAL | Status: DC | PRN

## 2013-12-14 MED ORDER — TRAMADOL HCL 50 MG PO TABS: ORAL_TABLET | ORAL | Status: DC

## 2013-12-14 NOTE — Progress Notes (Signed)
Subjective:    Patient ID: Jenna Gray, female    DOB: 30-Sep-1980, 33 y.o.   MRN: [id number] Chief Complaint  Patient presents with  . Establish Care    new patient  . Medication Refill   HPI  Was diagnosed with fibromyalgia in 2004 - tried cymbalta and effexor with no benefit. Tried neurontin but caused HAs. Tried amitryptiline and other TCAs but caused bad headaches. Feels like she has tried other medications as well but can't remember all the names.  Flexeril was to strong but did well on robaxin. With flexeril she would be really groggy in the morning even if she just tried it at night. Tramadol for about 10 years and still effective for her.  Takes 2 pills every 4 to 6 hours.  Ambien 5 mg as needed - was just started 1-2 mos ago but doesn't sleep well - alternates w/ tylenol p.m.  Insomnia problems have gotten a lot worse while working at 3rd shift - is trying to transfer to 1st shift while husband switches to 3rd shift.  But even despite the shift work, she doesn't sleep well - brain doesn't shut down. Melatonin worked for a while but stopped when she took it for a while.  Happened to check her BP at a grocery store and it was 150s/110s.  Husband on metoprolol. Does not have home bp cuff but will get one.  Has 4 kids at home.  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  Has been taking iron suppelemnt bid for several months.  Thniks she might have IBS-constipation. protonix helped a little.  Has used [place] for 3 months w/o any benefit.  If she is constipated, will take a fiber supplement.  Past Medical History  Diagnosis Date  . History of ovarian cyst 02/2000    DERMOID, LEFT  . Migraine   . TMJ (temporomandibular joint syndrome)   . Fibromyalgia   . Anemia   . Right ureteral stone   . Hypothyroidism 05/2012  . Renal disorder    Current Outpatient Prescriptions on File Prior to Visit  Medication Sig Dispense Refill  . ferrous sulfate 325 (65 FE) MG  tablet Take 1 tablet by mouth 2 (two) times daily.      Marland Kitchen ibuprofen (ADVIL,MOTRIN) 200 MG tablet Take 200-800 mg by mouth every 6 (six) hours as needed for pain. Headache or pain      . pantoprazole (PROTONIX) 40 MG tablet Take 1 tablet (40 mg total) by mouth daily.  90 tablet  3  . Prenatal Vit-Fe Fumarate-FA (PRENATAL MULTIVITAMIN) TABS Take 1 tablet by mouth daily.       No current facility-administered medications on file prior to visit.   Allergies  Allergen Reactions  . Penicillins Hives and Itching  . Neurontin [Gabapentin]     headache     Review of Systems  Respiratory: Negative for chest tightness and shortness of breath.   Cardiovascular: Negative for chest pain.  Musculoskeletal: Positive for arthralgias, back pain and myalgias.  Neurological: Positive for headaches.  Psychiatric/Behavioral: Positive for sleep disturbance. The patient is nervous/anxious.       BP 139/98  Pulse 90  Temp(Src) 99.1 F (37.3 C) (Oral)  Resp 18  Ht 5\' 1"  (1.549 m)  Wt 155 lb (70.308 kg)  BMI 29.30 kg/m2  SpO2 100%  LMP 11/25/2013 Objective:   Physical Exam  Constitutional: She is oriented to person, place, and time. She appears well-developed and well-nourished.  No distress.  HENT:  Head: Normocephalic and atraumatic.  Right Ear: External ear normal.  Left Ear: External ear normal.  Eyes: Conjunctivae are normal. No scleral icterus.  Neck: Normal range of motion. Neck supple. No thyromegaly present.  Cardiovascular: Normal rate, regular rhythm, normal heart sounds and intact distal pulses.   Pulmonary/Chest: Effort normal and breath sounds normal. No respiratory distress.  Abdominal: Soft. Bowel sounds are normal. She exhibits no distension and no mass. There is no tenderness. There is no rebound and no guarding.  Musculoskeletal: She exhibits no edema.  Lymphadenopathy:    She has no cervical adenopathy.  Neurological: She is alert and oriented to person, place, and time.    Skin: Skin is warm and dry. She is not diaphoretic. No erythema.  Psychiatric: She has a normal mood and affect. Her behavior is normal.          Assessment & Plan:   Vitamin D deficiency - Plan: Vit D  25 hydroxy (rtn osteoporosis monitoring)  Fibromyalgia - Plan: Lipid panel  GERD (gastroesophageal reflux disease)  Hypothyroid - Plan: Lipid panel  Iron deficiency anemia - Plan: CBC, Ferritin, IBC Panel  Unspecified constipation  Shift work sleep disorder  Meds ordered this encounter  Medications  . traMADol (ULTRAM) 50 MG tablet    Sig: TAKE ONE TO TWO TABLETS BY MOUTH EVERY 6 HOURS AS NEEDED (MAX  OF  400MG   DAILY)  DO  NOT  TAKE  WITH  AMBIEN.    Dispense:  240 tablet    Refill:  2  . zolpidem (AMBIEN) 5 MG tablet    Sig: Take 1 tablet (5 mg total) by mouth at bedtime as needed for sleep.    Dispense:  15 tablet    Refill:  2  . ergocalciferol (VITAMIN D2) 50000 UNITS capsule    Sig: Take 1 capsule (50,000 Units total) by mouth once a week.    Dispense:  4 capsule    Refill:  5    [name], MD MPH

## 2013-12-14 NOTE — Patient Instructions (Signed)

## 2013-12-14 NOTE — Patient Instructions (Signed)
Managing Your High Blood Pressure Blood pressure is a measurement of how forceful your blood is pressing against the walls of the arteries. Arteries are muscular tubes within the circulatory system. Blood pressure does not stay the same. Blood pressure rises when you are active, excited, or nervous; and it lowers during sleep and relaxation. If the numbers measuring your blood pressure stay above normal most of the time, you are at risk for health problems. High blood pressure (hypertension) is a long-term (chronic) condition in which blood pressure is elevated. A blood pressure reading is recorded as two numbers, such as 120 over 80 (or 120/80). The first, higher number is called the systolic pressure. It is a measure of the pressure in your arteries as the heart beats. The second, lower number is called the diastolic pressure. It is a measure of the pressure in your arteries as the heart relaxes between beats.  Keeping your blood pressure in a normal range is important to your overall health and prevention of health problems, such as heart disease and stroke. When your blood pressure is uncontrolled, your heart has to work harder than normal. High blood pressure is a very common condition in adults because blood pressure tends to rise with age. Men and women are equally likely to have hypertension but at different times in life. Before age 45, men are more likely to have hypertension. After 35 years of age, women are more likely to have it. Hypertension is especially common in African Americans. This condition often has no signs or symptoms. The cause of the condition is usually not known. Your caregiver can help you come up with a plan to keep your blood pressure in a normal, healthy range. BLOOD PRESSURE STAGES Blood pressure is classified into four stages: normal, prehypertension, stage 1, and stage 2. Your blood pressure reading will be used to determine what type of treatment, if any, is necessary.  Appropriate treatment options are tied to these four stages:  Normal  Systolic pressure (mm Hg): below 120.  Diastolic pressure (mm Hg): below 80. Prehypertension  Systolic pressure (mm Hg): 120 to 139.  Diastolic pressure (mm Hg): 80 to 89. Stage1  Systolic pressure (mm Hg): 140 to 159.  Diastolic pressure (mm Hg): 90 to 99. Stage2  Systolic pressure (mm Hg): 160 or above.  Diastolic pressure (mm Hg): 100 or above. RISKS RELATED TO HIGH BLOOD PRESSURE Managing your blood pressure is an important responsibility. Uncontrolled high blood pressure can lead to:  A heart attack.  A stroke.  A weakened blood vessel (aneurysm).  Heart failure.  Kidney damage.  Eye damage.  Metabolic syndrome.  Memory and concentration problems. HOW TO MANAGE YOUR BLOOD PRESSURE Blood pressure can be managed effectively with lifestyle changes and medicines (if needed). Your caregiver will help you come up with a plan to bring your blood pressure within a normal range. Your plan should include the following: Education  Read all information provided by your caregivers about how to control blood pressure.  Educate yourself on the latest guidelines and treatment recommendations. New research is always being done to further define the risks and treatments for high blood pressure. Lifestylechanges  Control your weight.  Avoid smoking.  Stay physically active.  Reduce the amount of salt in your diet.  Reduce stress.  Control any chronic conditions, such as high cholesterol or diabetes.  Reduce your alcohol intake. Medicines  Several medicines (antihypertensive medicines) are available, if needed, to bring blood pressure within a normal range.  Communication  Review all the medicines you take with your caregiver because there may be side effects or interactions.  Talk with your caregiver about your diet, exercise habits, and other lifestyle factors that may be contributing to  high blood pressure.  See your caregiver regularly. Your caregiver can help you create and adjust your plan for managing high blood pressure. RECOMMENDATIONS FOR TREATMENT AND FOLLOW-UP  The following recommendations are based on current guidelines for managing high blood pressure in nonpregnant adults. Use these recommendations to identify the proper follow-up period or treatment option based on your blood pressure reading. You can discuss these options with your caregiver.  Systolic pressure of 622 to 297 or diastolic pressure of 80 to 89: Follow up with your caregiver as directed.  Systolic pressure of 989 to 211 or diastolic pressure of 90 to 100: Follow up with your caregiver within 2 months.  Systolic pressure above 941 or diastolic pressure above 740: Follow up with your caregiver within 1 month.  Systolic pressure above 814 or diastolic pressure above 481: Consider antihypertensive therapy; follow up with your caregiver within 1 week.  Systolic pressure above 856 or diastolic pressure above 314: Begin antihypertensive therapy; follow up with your caregiver within 1 week. Document Released: 03/22/2012 Document Reviewed: 03/22/2012 [facility] Patient Information 2014 [place], [place]. DASH Diet The DASH diet stands for "Dietary Approaches to Stop Hypertension." It is a healthy eating plan that has been shown to reduce high blood pressure (hypertension) in as little as 14 days, while also possibly providing other significant health benefits. These other health benefits include reducing the risk of breast cancer after menopause and reducing the risk of type 2 diabetes, heart disease, colon cancer, and stroke. Health benefits also include weight loss and slowing kidney failure in patients with chronic kidney disease.  DIET GUIDELINES  Limit salt (sodium). Your diet should contain less than 1500 mg of sodium daily.  Limit refined or processed carbohydrates. Your diet should include mostly whole  grains. Desserts and added sugars should be used sparingly.  Include small amounts of heart-healthy fats. These types of fats include nuts, oils, and tub margarine. Limit saturated and trans fats. These fats have been shown to be harmful in the body. CHOOSING FOODS  The following food groups are based on a 2000 calorie diet. See your Registered Dietitian for individual calorie needs. Grains and Grain Products (6 to 8 servings daily)  Eat More Often: Whole-wheat bread, brown rice, whole-grain or wheat pasta, quinoa, popcorn without added fat or salt (air popped).  Eat Less Often: White bread, white pasta, white rice, cornbread. Vegetables (4 to 5 servings daily)  Eat More Often: Fresh, frozen, and canned vegetables. Vegetables may be raw, steamed, roasted, or grilled with a minimal amount of fat.  Eat Less Often/Avoid: Creamed or fried vegetables. Vegetables in a cheese sauce. Fruit (4 to 5 servings daily)  Eat More Often: All fresh, canned (in natural juice), or frozen fruits. Dried fruits without added sugar. One hundred percent fruit juice ( cup [237 mL] daily).  Eat Less Often: Dried fruits with added sugar. Canned fruit in light or heavy syrup. [employer], Fish, and Poultry (2 servings or less daily. One serving is 3 to 4 oz [85-114 g]).  Eat More Often: Ninety percent or leaner ground beef, tenderloin, sirloin. Round cuts of beef, chicken breast, [place] breast. All fish. Grill, bake, or broil your meat. Nothing should be fried.  Eat Less Often/Avoid: Fatty cuts of meat, [place], or chicken  leg, thigh, or wing. Fried cuts of meat or fish. Dairy (2 to 3 servings)  Eat More Often: Low-fat or fat-free milk, low-fat plain or light yogurt, reduced-fat or part-skim cheese.  Eat Less Often/Avoid: Milk (whole, 2%).Whole milk yogurt. Full-fat cheeses. Nuts, Seeds, and Legumes (4 to 5 servings per week)  Eat More Often: All without added salt.  Eat Less Often/Avoid: Salted nuts and  seeds, canned beans with added salt. Fats and Sweets (limited)  Eat More Often: Vegetable oils, tub margarines without trans fats, sugar-free gelatin. Mayonnaise and salad dressings.  Eat Less Often/Avoid: Coconut oils, palm oils, butter, stick margarine, cream, half and half, cookies, candy, pie. FOR MORE INFORMATION The Dash Diet Eating Plan: www.dashdiet.org Document Released: 06/17/2011 Document Revised: 09/20/2011 Document Reviewed: 06/17/2011 [facility] Patient Information 2014 [place], [place]. Fibromyalgia Fibromyalgia is a disorder that is often misunderstood. It is associated with muscular pains and tenderness that comes and goes. It is often associated with fatigue and sleep disturbances. Though it tends to be long-lasting, fibromyalgia is not life-threatening. CAUSES  The exact cause of fibromyalgia is unknown. People with certain gene types are predisposed to developing fibromyalgia and other conditions. Certain factors can play a role as triggers, such as:  Spine disorders.  Arthritis.  Severe injury (trauma) and other physical stressors.  Emotional stressors. SYMPTOMS   The main symptom is pain and stiffness in the muscles and joints, which can vary over time.  Sleep and fatigue problems. Other related symptoms may include:  Bowel and bladder problems.  Headaches.  Visual problems.  Problems with odors and noises.  Depression or mood changes.  Painful periods (dysmenorrhea).  Dryness of the skin or eyes. DIAGNOSIS  There are no specific tests for diagnosing fibromyalgia. Patients can be diagnosed accurately from the specific symptoms they have. The diagnosis is made by determining that nothing else is causing the problems. TREATMENT  There is no cure. Management includes medicines and an active, healthy lifestyle. The goal is to enhance physical fitness, decrease pain, and improve sleep. HOME CARE INSTRUCTIONS   Only take over-the-counter or prescription  medicines as directed by your caregiver. Sleeping pills, tranquilizers, and pain medicines may make your problems worse.  Low-impact aerobic exercise is very important and advised for treatment. At first, it may seem to make pain worse. Gradually increasing your tolerance will overcome this feeling.  Learning relaxation techniques and how to control stress will help you. Biofeedback, visual imagery, hypnosis, muscle relaxation, yoga, and meditation are all options.  Anti-inflammatory medicines and physical therapy may provide short-term help.  Acupuncture or massage treatments may help.  Take muscle relaxant medicines as suggested by your caregiver.  Avoid stressful situations.  Plan a healthy lifestyle. This includes your diet, sleep, rest, exercise, and friends.  Find and practice a hobby you enjoy.  Join a fibromyalgia support group for interaction, ideas, and sharing advice. This may be helpful. SEEK MEDICAL CARE IF:  You are not having good results or improvement from your treatment. FOR MORE INFORMATION  National Fibromyalgia Association: www.fmaware.[facility]: www.arthritis.org Document Released: 06/28/2005 Document Revised: 09/20/2011 Document Reviewed: 10/08/2009 [facility] Patient Information 2014 [place], [place].

## 2013-12-14 NOTE — Progress Notes (Signed)
   Patient presented to the office today for her ongoing evaluation of her menorrhagia and dysfunction uterine bleeding. Patient had been seen in May of this year for her annual exam and her voice is concerned. Patient with prior tubal sterilization procedure. Patient with normal Pap smear last month. Patient taking iron supplementation as a result of her iron deficiency anemia due to her menorrhagia.  Patient's ultrasound today demonstrated the following: Uterus measuring 9.5 x 5.7 x 4.6 cm with endometrial stripe is 16.8 mm. Patient's last menstrual period was May 17. Patient had 1 single mid uterine fibroid intramural measuring 25 x 14 x 21 mm. Both ovaries appeared to be normal. A sterile catheter was introduced into the uterine cavity and sterile saline was instilled and no intracavitary defects are noted.  Patient was counseled also for an endometrial biopsy and a sterile Pipelle was introduced into the uterine cavity for sampling and tissue submitted for histological evaluation  Patient had previously been provided with information and today was informed and counseled on outpatient office setting endometrial ablation with the her option technique which she would like to move forward.  Assessment/plan: Patient with menorrhagia candidate for her option endometrial ablation. Normal Pap smear. Normal sonohysterogram. Endometrial biopsy done today results pending. Patient will be prescribed Prometrium 200 mg to take daily starting June 14 and we will plan on doing her procedure here in the office on June 26.

## 2013-12-15 LAB — VITAMIN D 25 HYDROXY (VIT D DEFICIENCY, FRACTURES): Vit D, 25-Hydroxy: 12 ng/mL — ABNORMAL LOW (ref 30–89)

## 2013-12-18 DIAGNOSIS — E559 Vitamin D deficiency, unspecified: Secondary | ICD-10-CM | POA: Insufficient documentation

## 2013-12-18 MED ORDER — ERGOCALCIFEROL 1.25 MG (50000 UT) PO CAPS: 50000.0000 [IU] | ORAL_CAPSULE | ORAL | Status: DC

## 2013-12-28 ENCOUNTER — Telehealth: Payer: Self-pay

## 2013-12-28 NOTE — Telephone Encounter (Signed)
I had checked ins benefits and left a message on 12/17/13 for patient to call me.  She has not called me back and I tried to reach her today and left another message.  She was due to start Prometrium 200mg . On 12/23/13.  I left a message today and asked her to please call as we are holding 3 hours in ultrasound that I need to free up to use.

## 2013-12-31 NOTE — Telephone Encounter (Signed)
Since I have not heard from patient yet we cancelled the time I was holding as Jenna Gray was needing to use it for another patient.  I will wait to hear from patient.

## 2014-03-05 ENCOUNTER — Other Ambulatory Visit: Payer: Self-pay | Admitting: Family Medicine

## 2014-03-08 NOTE — Telephone Encounter (Signed)
Will approve tramadol refill x 1 as pt has appt scheduled w/ me on 9/11 for additional refills.

## 2014-03-08 NOTE — Telephone Encounter (Signed)
Pt called inquiring about this medication refill, please advise pt

## 2014-03-09 NOTE — Telephone Encounter (Signed)
Faxed

## 2014-03-22 ENCOUNTER — Ambulatory Visit (INDEPENDENT_AMBULATORY_CARE_PROVIDER_SITE_OTHER): Payer: 59 | Admitting: Family Medicine

## 2014-03-22 ENCOUNTER — Encounter: Payer: Self-pay | Admitting: Family Medicine

## 2014-03-22 VITALS — BP 124/86 | HR 88 | Temp 98.9°F | Resp 16 | Ht 62.0 in | Wt 150.2 lb

## 2014-03-22 DIAGNOSIS — R030 Elevated blood-pressure reading, without diagnosis of hypertension: Secondary | ICD-10-CM

## 2014-03-22 DIAGNOSIS — D509 Iron deficiency anemia, unspecified: Secondary | ICD-10-CM

## 2014-03-22 DIAGNOSIS — E559 Vitamin D deficiency, unspecified: Secondary | ICD-10-CM

## 2014-03-22 DIAGNOSIS — G4700 Insomnia, unspecified: Secondary | ICD-10-CM

## 2014-03-22 DIAGNOSIS — F411 Generalized anxiety disorder: Secondary | ICD-10-CM

## 2014-03-22 DIAGNOSIS — M797 Fibromyalgia: Secondary | ICD-10-CM

## 2014-03-22 DIAGNOSIS — IMO0001 Reserved for inherently not codable concepts without codable children: Secondary | ICD-10-CM

## 2014-03-22 DIAGNOSIS — F419 Anxiety disorder, unspecified: Secondary | ICD-10-CM

## 2014-03-22 DIAGNOSIS — R0989 Other specified symptoms and signs involving the circulatory and respiratory systems: Secondary | ICD-10-CM

## 2014-03-22 LAB — CBC WITH DIFFERENTIAL/PLATELET
BASOS PCT: 1 % (ref 0–1)
Basophils Absolute: 0.1 10*3/uL (ref 0.0–0.1)
EOS PCT: 3 % (ref 0–5)
Eosinophils Absolute: 0.2 10*3/uL (ref 0.0–0.7)
HEMATOCRIT: 30.4 % — AB (ref 36.0–46.0)
HEMOGLOBIN: 9.4 g/dL — AB (ref 12.0–15.0)
Lymphocytes Relative: 24 % (ref 12–46)
Lymphs Abs: 1.4 10*3/uL (ref 0.7–4.0)
MCH: 19.5 pg — AB (ref 26.0–34.0)
MCHC: 30.9 g/dL (ref 30.0–36.0)
MCV: 63.1 fL — AB (ref 78.0–100.0)
MONO ABS: 0.5 10*3/uL (ref 0.1–1.0)
MONOS PCT: 9 % (ref 3–12)
NEUTROS ABS: 3.6 10*3/uL (ref 1.7–7.7)
Neutrophils Relative %: 63 % (ref 43–77)
Platelets: 437 10*3/uL — ABNORMAL HIGH (ref 150–400)
RBC: 4.82 MIL/uL (ref 3.87–5.11)
RDW: 17 % — AB (ref 11.5–15.5)
WBC: 5.7 10*3/uL (ref 4.0–10.5)

## 2014-03-22 LAB — FERRITIN: Ferritin: 1 ng/mL — ABNORMAL LOW (ref 10–291)

## 2014-03-22 MED ORDER — DILTIAZEM HCL 30 MG PO TABS: 30.0000 mg | ORAL_TABLET | Freq: Four times a day (QID) | ORAL | Status: DC | PRN

## 2014-03-22 MED ORDER — ZOLPIDEM TARTRATE 5 MG PO TABS: 5.0000 mg | ORAL_TABLET | Freq: Every evening | ORAL | Status: DC | PRN

## 2014-03-22 MED ORDER — BUTALBITAL-APAP-CAFFEINE 50-325-40 MG PO TABS: 1.0000 | ORAL_TABLET | Freq: Four times a day (QID) | ORAL | Status: DC | PRN

## 2014-03-22 MED ORDER — TRAMADOL HCL 50 MG PO TABS: 50.0000 mg | ORAL_TABLET | Freq: Four times a day (QID) | ORAL | Status: DC | PRN

## 2014-03-22 NOTE — Patient Instructions (Signed)
Lets start an experiment to try to pin point these blood pressure elevations and headache.  Try to check your blood pressure and pulse every day that you are at work and record a log.  When you have a headache, check your blood pressure and pulse again.  If you blood pressure is >140/90 and your pulse is >80 beats/minute, then take a diltiazem.  If after an hour your headache continues, please check your blood pressure and pulse again - if still elevated take a second diltiazem.  If after an hour your headache still persists, then you can take a fioricet.  After your headache goes away, check your blood pressure and pulse again.  We will review these at your next office visit and decide if you need to be further evalutated by a cardiologist, nephrologist, and/or have further testing of your heart (with a holter monitor) and better assess your kidney and adrenal glands.  Managing Your High Blood Pressure Blood pressure is a measurement of how forceful your blood is pressing against the walls of the arteries. Arteries are muscular tubes within the circulatory system. Blood pressure does not stay the same. Blood pressure rises when you are active, excited, or nervous; and it lowers during sleep and relaxation. If the numbers measuring your blood pressure stay above normal most of the time, you are at risk for health problems. High blood pressure (hypertension) is a long-term (chronic) condition in which blood pressure is elevated. A blood pressure reading is recorded as two numbers, such as 120 over 80 (or 120/80). The first, higher number is called the systolic pressure. It is a measure of the pressure in your arteries as the heart beats. The second, lower number is called the diastolic pressure. It is a measure of the pressure in your arteries as the heart relaxes between beats.  Keeping your blood pressure in a normal range is important to your overall health and prevention of health problems, such as heart  disease and stroke. When your blood pressure is uncontrolled, your heart has to work harder than normal. High blood pressure is a very common condition in adults because blood pressure tends to rise with age. Men and women are equally likely to have hypertension but at different times in life. Before age 86, men are more likely to have hypertension. After 35 years of age, women are more likely to have it. Hypertension is especially common in African Americans. This condition often has no signs or symptoms. The cause of the condition is usually not known. Your caregiver can help you come up with a plan to keep your blood pressure in a normal, healthy range. BLOOD PRESSURE STAGES Blood pressure is classified into four stages: normal, prehypertension, stage 1, and stage 2. Your blood pressure reading will be used to determine what type of treatment, if any, is necessary. Appropriate treatment options are tied to these four stages:  Normal  Systolic pressure (mm Hg): below 120.  Diastolic pressure (mm Hg): below 80. Prehypertension  Systolic pressure (mm Hg): 120 to 139.  Diastolic pressure (mm Hg): 80 to 89. Stage1  Systolic pressure (mm Hg): 140 to 159.  Diastolic pressure (mm Hg): 90 to 99. Stage2  Systolic pressure (mm Hg): 160 or above.  Diastolic pressure (mm Hg): 100 or above. RISKS RELATED TO HIGH BLOOD PRESSURE Managing your blood pressure is an important responsibility. Uncontrolled high blood pressure can lead to:  A heart attack.  A stroke.  A weakened blood vessel (aneurysm).  Heart failure.  Kidney damage.  Eye damage.  Metabolic syndrome.  Memory and concentration problems. HOW TO MANAGE YOUR BLOOD PRESSURE Blood pressure can be managed effectively with lifestyle changes and medicines (if needed). Your caregiver will help you come up with a plan to bring your blood pressure within a normal range. Your plan should include the following: Education  Read all  information provided by your caregivers about how to control blood pressure.  Educate yourself on the latest guidelines and treatment recommendations. New research is always being done to further define the risks and treatments for high blood pressure. Lifestylechanges  Control your weight.  Avoid smoking.  Stay physically active.  Reduce the amount of salt in your diet.  Reduce stress.  Control any chronic conditions, such as high cholesterol or diabetes.  Reduce your alcohol intake. Medicines  Several medicines (antihypertensive medicines) are available, if needed, to bring blood pressure within a normal range. Communication  Review all the medicines you take with your caregiver because there may be side effects or interactions.  Talk with your caregiver about your diet, exercise habits, and other lifestyle factors that may be contributing to high blood pressure.  See your caregiver regularly. Your caregiver can help you create and adjust your plan for managing high blood pressure. RECOMMENDATIONS FOR TREATMENT AND FOLLOW-UP  The following recommendations are based on current guidelines for managing high blood pressure in nonpregnant adults. Use these recommendations to identify the proper follow-up period or treatment option based on your blood pressure reading. You can discuss these options with your caregiver.  Systolic pressure of 163 to 846 or diastolic pressure of 80 to 89: Follow up with your caregiver as directed.  Systolic pressure of 659 to 935 or diastolic pressure of 90 to 100: Follow up with your caregiver within 2 months.  Systolic pressure above 701 or diastolic pressure above 779: Follow up with your caregiver within 1 month.  Systolic pressure above 390 or diastolic pressure above 300: Consider antihypertensive therapy; follow up with your caregiver within 1 week.  Systolic pressure above 923 or diastolic pressure above 300: Begin antihypertensive therapy;  follow up with your caregiver within 1 week. Document Released: 03/22/2012 Document Reviewed: 03/22/2012 [facility] Patient Information 2015 [place]. This information is not intended to replace advice given to you by your health care provider. Make sure you discuss any questions you have with your health care provider. Tension Headache A tension headache is a feeling of pain, pressure, or aching often felt over the front and sides of the head. The pain can be dull or can feel tight (constricting). It is the most common type of headache. Tension headaches are not normally associated with nausea or vomiting and do not get worse with physical activity. Tension headaches can last 30 minutes to several days.  CAUSES  The exact cause is not known, but it may be caused by chemicals and hormones in the brain that lead to pain. Tension headaches often begin after stress, anxiety, or depression. Other triggers may include:  Alcohol.  Caffeine (too much or withdrawal).  Respiratory infections (colds, flu, sinus infections).  Dental problems or teeth clenching.  Fatigue.  Holding your head and neck in one position too long while using a computer. SYMPTOMS   Pressure around the head.   Dull, aching head pain.   Pain felt over the front and sides of the head.   Tenderness in the muscles of the head, neck, and shoulders. DIAGNOSIS  A tension  headache is often diagnosed based on:   Symptoms.   Physical examination.   A CT scan or MRI of your head. These tests may be ordered if symptoms are severe or unusual. TREATMENT  Medicines may be given to help relieve symptoms.  HOME CARE INSTRUCTIONS   Only take over-the-counter or prescription medicines for pain or discomfort as directed by your caregiver.   Lie down in a dark, quiet room when you have a headache.   Keep a journal to find out what may be triggering your headaches. For example, write down:  What you eat and  drink.  How much sleep you get.  Any change to your diet or medicines.  Try massage or other relaxation techniques.   Ice packs or heat applied to the head and neck can be used. Use these 3 to 4 times per day for 15 to 20 minutes each time, or as needed.   Limit stress.   Sit up straight, and do not tense your muscles.   Quit smoking if you smoke.  Limit alcohol use.  Decrease the amount of caffeine you drink, or stop drinking caffeine.  Eat and exercise regularly.  Get 7 to 9 hours of sleep, or as recommended by your caregiver.  Avoid excessive use of pain medicine as recurrent headaches can occur.  SEEK MEDICAL CARE IF:   You have problems with the medicines you were prescribed.  Your medicines do not work.  You have a change from the usual headache.  You have nausea or vomiting. SEEK IMMEDIATE MEDICAL CARE IF:   Your headache becomes severe.  You have a fever.  You have a stiff neck.  You have loss of vision.  You have muscular weakness or loss of muscle control.  You lose your balance or have trouble walking.  You feel faint or pass out.  You have severe symptoms that are different from your first symptoms. MAKE SURE YOU:   Understand these instructions.  Will watch your condition.  Will get help right away if you are not doing well or get worse. Document Released: 06/28/2005 Document Revised: 09/20/2011 Document Reviewed: 06/18/2011 [facility] Patient Information 2015 [place], [place]. This information is not intended to replace advice given to you by your health care provider. Make sure you discuss any questions you have with your health care provider.

## 2014-03-22 NOTE — Progress Notes (Addendum)
Subjective:    Patient ID: Jenna Gray, female    DOB: Jan 27, 1981, 33 y.o.   MRN: [id number] This chart was scribed for Shawnee Knapp, MD by Cathie Hoops, ED Scribe. The patient was seen in Room 24. The patient's care was started at 8:34 AM.   03/22/2014 Chief Complaint  Patient presents with  . Follow-up    medication     HPI HPI Comments: Jenna Gray is a 33 y.o. female who presents to the [facility] for a follow-up visit for fibromyalgia, anemia, and high cholesterol.  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 She notes she is able to fall asleep easier but does note she wakes up regularly. Pt is doing well on Ambien. Pt's husband is now working 3rd shift, so they can balance childcare.  Fibromyalgia She notes her fibromyalgia is doing well. She states she had a small flare-up but is otherwise still doing well.   Anemia/Cholesterol Pt is taking an iron supplement 2x/day and Vitamin D supplement. She notes she is eating better now that she has changed her shift, and states she is able to eat breakfast, lunch and dinner which is improved compared to eating less than 2 meals/day.  BP Pt states she has severe headaches about 2-3x/week that lasts for a few hours and self-resolve. She also notes associated dizziness. She states she typically has elevated BP during her headache episodes. She notes her highest BP was 163/110. Her most recent elevated BP reading was 153/103 which occurred earlier this week. Pt notes she hears a "whoosing" sound in her ears during her headaches. Pt also notes that her headaches occur at different times of the day. She denies she finds relief with Tramadol or OTC medication. Pt denies diaphoresis, shakiness, chest pain, visual disturbances, palpitations, or anxiety.  Jenna Gray is a new pt, who I first met 3 months ago. She has a long standing hx of  fibromyalgia for which has been tried multiple medications but has only had relief of Tramadol which she has been on for 10 years. She has chronic insomnia which is made worse by working 3rd shift, started on Ambien. She also is anemic and was instructed to take an iron replacement as well as Vitamin D and to work on a low cholesterol diet.   Past Medical History  Diagnosis Date  . History of ovarian cyst 02/2000    DERMOID, LEFT  . Migraine   . TMJ (temporomandibular joint syndrome)   . Fibromyalgia   . Anemia   . Right ureteral stone   . Hypothyroidism 05/2012  . Renal disorder    Current Outpatient Prescriptions on File Prior to Visit  Medication Sig Dispense Refill  . ergocalciferol (VITAMIN D2) 50000 UNITS capsule Take 1 capsule (50,000 Units total) by mouth once a week.  4 capsule  5  . ferrous sulfate 325 (65 FE) MG tablet Take 1 tablet by mouth 2 (two) times daily.      Marland Kitchen ibuprofen (ADVIL,MOTRIN) 200 MG tablet Take 200-800 mg by mouth every 6 (six) hours as needed for pain. Headache or pain      . pantoprazole (PROTONIX) 40 MG tablet Take 1 tablet (40 mg total) by mouth daily.  90 tablet  3  . traMADol (ULTRAM) 50 MG tablet TAKE 1 TO 2 TABLETS BY MOUTH EVERY 6 HOURS AS NEEDED. (MAX 8 TABLETS PER  DAY.) DO NOT TAKE WITH AMBIEN.  240 tablet  0  . zolpidem (AMBIEN) 5 MG tablet Take 1 tablet (5 mg total) by mouth at bedtime as needed for sleep.  15 tablet  2  . Prenatal Vit-Fe Fumarate-FA (PRENATAL MULTIVITAMIN) TABS Take 1 tablet by mouth daily.       No current facility-administered medications on file prior to visit.   Allergies  Allergen Reactions  . Penicillins Hives and Itching  . Neurontin [Gabapentin]     headache    Review of Systems  Constitutional: Negative for diaphoresis.  Eyes: Negative for visual disturbance.  Respiratory: Negative for shortness of breath.   Cardiovascular: Negative for chest pain and palpitations.  Neurological: Positive for dizziness and  headaches. Negative for syncope.  Psychiatric/Behavioral: The patient is not nervous/anxious.   All other systems reviewed and are negative.  Objective:  Triage Vitals: BP 124/86  Pulse 88  Temp(Src) 98.9 F (37.2 C) (Oral)  Resp 16  Ht '5\' 2"'  (1.575 m)  Wt 150 lb 3.2 oz (68.13 kg)  BMI 27.46 kg/m2  SpO2 100%  LMP 02/28/2014  Physical Exam  Nursing note and vitals reviewed. Constitutional: She is oriented to person, place, and time. She appears well-developed and well-nourished. No distress.  HENT:  Head: Normocephalic and atraumatic.  Eyes: Conjunctivae and EOM are normal.  Neck: Neck supple. No tracheal deviation present. No thyromegaly present.  Cardiovascular: Regular rhythm and normal heart sounds.  Tachycardia present.   Pulmonary/Chest: Effort normal and breath sounds normal. No respiratory distress.  Musculoskeletal: Normal range of motion.  Neurological: She is alert and oriented to person, place, and time.  Skin: Skin is warm and dry.  Psychiatric: She has a normal mood and affect. Her behavior is normal.   .this Assessment & Plan:  8:46 AM- Patient informed of current plan for treatment and evaluation and agrees with plan at this time.  Elevated blood pressure (not hypertension) - Pt encouraged to keep a log of headaches, BP readings and her pulse. Try to check blood pressure and pulse every day that you are at work and record a log.  When you have a headache, check your blood pressure and pulse again.  If you blood pressure is >140/90 and your pulse is >80 beats/minute, then take a diltiazem.  If after an hour your headache continues, please check your blood pressure and pulse again - if still elevated take a second diltiazem.  If after an hour your headache still persists, then you can take a fioricet.  After your headache goes away, check your blood pressure and pulse again.  We will review these at your next office visit and decide if you need to be further evalutated  by a cardiologist, nephrologist, and/or have further testing of your heart (with a holter monitor) and better assess your kidney and adrenal glands.   Labile blood pressure  Insomnia  Vitamin D deficiency - Plan: Vit D  25 hydroxy (rtn osteoporosis monitoring) - still quite low - continue on weekly supplementation.  Iron deficiency anemia - Plan: CBC with Differential, Ferritin - still w/ severe iron deficiency - push mvi and ferrous sulfate bid supp, high iron diet. If not improved at f/u, cons hematology referral and pt may need IV iron if not absorbing oral  Fibromyalgia - recheck in 3 mos for refills  Anxiety  Meds ordered this encounter  Medications  . zolpidem (AMBIEN) 5 MG tablet    Sig: Take 1 tablet (5 mg total)  by mouth at bedtime as needed for sleep.    Dispense:  15 tablet    Refill:  2  . traMADol (ULTRAM) 50 MG tablet    Sig: Take 1-2 tablets (50-100 mg total) by mouth every 6 (six) hours as needed. Max 8 tabs/day. Do not combine with ambien.    Dispense:  240 tablet    Refill:  2  . diltiazem (CARDIZEM) 30 MG tablet    Sig: Take 1 tablet (30 mg total) by mouth 4 (four) times daily as needed (elevated blood pressure >140/90).    Dispense:  120 tablet    Refill:  0  . butalbital-acetaminophen-caffeine (FIORICET) 50-325-40 MG per tablet    Sig: Take 1-2 tablets by mouth every 6 (six) hours as needed for headache.    Dispense:  20 tablet    Refill:  1    I personally performed the services described in this documentation, which was scribed in my presence. The recorded information has been reviewed and considered, and addended by me as needed.  [name], MD MPH  Results for orders placed in visit on 03/22/14  VITAMIN D 25 HYDROXY      Result Value Ref Range   Vit D, 25-Hydroxy 13 (*) 30 - 89 ng/mL  CBC WITH DIFFERENTIAL      Result Value Ref Range   WBC 5.7  4.0 - 10.5 K/uL   RBC 4.82  3.87 - 5.11 MIL/uL   Hemoglobin 9.4 (*) 12.0 - 15.0 g/dL   HCT 30.4 (*)  36.0 - 46.0 %   MCV 63.1 (*) 78.0 - 100.0 fL   MCH 19.5 (*) 26.0 - 34.0 pg   MCHC 30.9  30.0 - 36.0 g/dL   RDW 17.0 (*) 11.5 - 15.5 %   Platelets 437 (*) 150 - 400 K/uL   Neutrophils Relative % 63  43 - 77 %   Neutro Abs 3.6  1.7 - 7.7 K/uL   Lymphocytes Relative 24  12 - 46 %   Lymphs Abs 1.4  0.7 - 4.0 K/uL   Monocytes Relative 9  3 - 12 %   Monocytes Absolute 0.5  0.1 - 1.0 K/uL   Eosinophils Relative 3  0 - 5 %   Eosinophils Absolute 0.2  0.0 - 0.7 K/uL   Basophils Relative 1  0 - 1 %   Basophils Absolute 0.1  0.0 - 0.1 K/uL   Smear Review Criteria for review not met    FERRITIN      Result Value Ref Range   Ferritin 1 (*) 10 - 291 ng/mL

## 2014-03-23 LAB — VITAMIN D 25 HYDROXY (VIT D DEFICIENCY, FRACTURES): VIT D 25 HYDROXY: 13 ng/mL — AB (ref 30–89)

## 2014-04-18 ENCOUNTER — Telehealth: Payer: Self-pay | Admitting: *Deleted

## 2014-04-18 NOTE — Telephone Encounter (Signed)
Yes, fine to have early refill. Please alert pharmacy.

## 2014-04-18 NOTE — Telephone Encounter (Signed)
Patient is going out of town for a funeral and needs refill on the Tramadol medication. She will be gone for 3 weeks. Please advise if this early fill is authorized. Pt has 2 refills available at the pharmacy. Last filled 03/28/2014

## 2014-04-19 NOTE — Telephone Encounter (Signed)
Called pt and left VM stating that we are calling pharmacy to approve early refill.  Pharmacy notified.

## 2014-05-13 ENCOUNTER — Other Ambulatory Visit: Payer: Self-pay | Admitting: Family Medicine

## 2014-05-13 ENCOUNTER — Encounter: Payer: Self-pay | Admitting: Family Medicine

## 2014-05-13 NOTE — Telephone Encounter (Signed)
Faxed

## 2014-05-26 ENCOUNTER — Encounter: Payer: Self-pay | Admitting: Family Medicine

## 2014-05-28 ENCOUNTER — Other Ambulatory Visit: Payer: Self-pay | Admitting: Family Medicine

## 2014-05-28 ENCOUNTER — Encounter: Payer: Self-pay | Admitting: Family Medicine

## 2014-05-28 MED ORDER — NITROFURANTOIN MONOHYD MACRO 100 MG PO CAPS: 100.0000 mg | ORAL_CAPSULE | Freq: Two times a day (BID) | ORAL | Status: DC

## 2014-06-02 ENCOUNTER — Emergency Department (HOSPITAL_BASED_OUTPATIENT_CLINIC_OR_DEPARTMENT_OTHER)
Admission: EM | Admit: 2014-06-02 | Discharge: 2014-06-03 | Disposition: A | Discharge status: Home / Self Care | Payer: 59 | Attending: Emergency Medicine | Admitting: Emergency Medicine

## 2014-06-02 ENCOUNTER — Encounter (HOSPITAL_BASED_OUTPATIENT_CLINIC_OR_DEPARTMENT_OTHER): Payer: Self-pay | Admitting: *Deleted

## 2014-06-02 ENCOUNTER — Emergency Department (HOSPITAL_BASED_OUTPATIENT_CLINIC_OR_DEPARTMENT_OTHER): Payer: 59

## 2014-06-02 DIAGNOSIS — Z880 Allergy status to penicillin: Secondary | ICD-10-CM | POA: Insufficient documentation

## 2014-06-02 DIAGNOSIS — Z9889 Other specified postprocedural states: Secondary | ICD-10-CM | POA: Insufficient documentation

## 2014-06-02 DIAGNOSIS — R1084 Generalized abdominal pain: Secondary | ICD-10-CM

## 2014-06-02 DIAGNOSIS — Z8679 Personal history of other diseases of the circulatory system: Secondary | ICD-10-CM | POA: Insufficient documentation

## 2014-06-02 DIAGNOSIS — N12 Tubulo-interstitial nephritis, not specified as acute or chronic: Secondary | ICD-10-CM | POA: Insufficient documentation

## 2014-06-02 DIAGNOSIS — Z8639 Personal history of other endocrine, nutritional and metabolic disease: Secondary | ICD-10-CM | POA: Diagnosis not present

## 2014-06-02 DIAGNOSIS — Z8742 Personal history of other diseases of the female genital tract: Secondary | ICD-10-CM | POA: Diagnosis not present

## 2014-06-02 DIAGNOSIS — Z79899 Other long term (current) drug therapy: Secondary | ICD-10-CM | POA: Insufficient documentation

## 2014-06-02 DIAGNOSIS — Z3202 Encounter for pregnancy test, result negative: Secondary | ICD-10-CM | POA: Insufficient documentation

## 2014-06-02 DIAGNOSIS — G441 Vascular headache, not elsewhere classified: Secondary | ICD-10-CM | POA: Insufficient documentation

## 2014-06-02 DIAGNOSIS — R109 Unspecified abdominal pain: Secondary | ICD-10-CM

## 2014-06-02 DIAGNOSIS — D649 Anemia, unspecified: Secondary | ICD-10-CM | POA: Insufficient documentation

## 2014-06-02 DIAGNOSIS — Z87442 Personal history of urinary calculi: Secondary | ICD-10-CM | POA: Insufficient documentation

## 2014-06-02 DIAGNOSIS — M797 Fibromyalgia: Secondary | ICD-10-CM | POA: Diagnosis not present

## 2014-06-02 LAB — CBC WITH DIFFERENTIAL/PLATELET
BASOS ABS: 0 10*3/uL (ref 0.0–0.1)
Basophils Relative: 0 % (ref 0–1)
Eosinophils Absolute: 0.3 10*3/uL (ref 0.0–0.7)
Eosinophils Relative: 4 % (ref 0–5)
HCT: 30.8 % — ABNORMAL LOW (ref 36.0–46.0)
Hemoglobin: 8.8 g/dL — ABNORMAL LOW (ref 12.0–15.0)
LYMPHS PCT: 20 % (ref 12–46)
Lymphs Abs: 1.6 10*3/uL (ref 0.7–4.0)
MCH: 19.5 pg — ABNORMAL LOW (ref 26.0–34.0)
MCHC: 28.6 g/dL — ABNORMAL LOW (ref 30.0–36.0)
MCV: 68.3 fL — ABNORMAL LOW (ref 78.0–100.0)
MONOS PCT: 11 % (ref 3–12)
Monocytes Absolute: 0.9 10*3/uL (ref 0.1–1.0)
NEUTROS ABS: 5.3 10*3/uL (ref 1.7–7.7)
Neutrophils Relative %: 65 % (ref 43–77)
Platelets: 314 10*3/uL (ref 150–400)
RBC: 4.51 MIL/uL (ref 3.87–5.11)
RDW: 18.3 % — ABNORMAL HIGH (ref 11.5–15.5)
WBC: 8.1 10*3/uL (ref 4.0–10.5)

## 2014-06-02 LAB — COMPREHENSIVE METABOLIC PANEL
ALBUMIN: 3.5 g/dL (ref 3.5–5.2)
ALT: 26 U/L (ref 0–35)
AST: 28 U/L (ref 0–37)
Alkaline Phosphatase: 86 U/L (ref 39–117)
Anion gap: 11 (ref 5–15)
BUN: 12 mg/dL (ref 6–23)
CO2: 24 meq/L (ref 19–32)
Calcium: 8.7 mg/dL (ref 8.4–10.5)
Chloride: 102 mEq/L (ref 96–112)
Creatinine, Ser: 0.8 mg/dL (ref 0.50–1.10)
GFR calc Af Amer: >90 mL/min (ref >90)
GFR calc non Af Amer: >90 mL/min (ref >90)
Glucose, Bld: 103 mg/dL — ABNORMAL HIGH (ref 70–99)
Potassium: 3.9 mEq/L (ref 3.7–5.3)
SODIUM: 137 meq/L (ref 137–147)
Total Bilirubin: <0.2 mg/dL — ABNORMAL LOW (ref 0.3–1.2)
Total Protein: 7.7 g/dL (ref 6.0–8.3)

## 2014-06-02 LAB — URINALYSIS, ROUTINE W REFLEX MICROSCOPIC
Bilirubin Urine: NEGATIVE
GLUCOSE, UA: NEGATIVE mg/dL
HGB URINE DIPSTICK: NEGATIVE
KETONES UR: NEGATIVE mg/dL
LEUKOCYTES UA: NEGATIVE
Nitrite: NEGATIVE
PH: 7.5 (ref 5.0–8.0)
Protein, ur: NEGATIVE mg/dL
Specific Gravity, Urine: 1.017 (ref 1.005–1.030)
Urobilinogen, UA: 0.2 mg/dL (ref 0.0–1.0)

## 2014-06-02 LAB — LIPASE, BLOOD: Lipase: 52 U/L (ref 11–59)

## 2014-06-02 LAB — PREGNANCY, URINE: Preg Test, Ur: NEGATIVE

## 2014-06-02 MED ORDER — CIPROFLOXACIN HCL 500 MG PO TABS: 500.0000 mg | ORAL_TABLET | Freq: Two times a day (BID) | ORAL | Status: DC

## 2014-06-02 MED ORDER — ONDANSETRON HCL 4 MG/2ML IJ SOLN
4.0000 mg | Freq: Once | INTRAMUSCULAR | Status: AC
  Administered 2014-06-02: 4 mg via INTRAVENOUS
  Filled 2014-06-02: qty 2

## 2014-06-02 MED ORDER — SODIUM CHLORIDE 0.9 % IV BOLUS (SEPSIS)
500.0000 mL | Freq: Once | INTRAVENOUS | Status: AC
  Administered 2014-06-02: 500 mL via INTRAVENOUS

## 2014-06-02 MED ORDER — HYDROCODONE-ACETAMINOPHEN 5-325 MG PO TABS: 1.0000 | ORAL_TABLET | Freq: Four times a day (QID) | ORAL | Status: DC | PRN

## 2014-06-02 MED ORDER — PROMETHAZINE HCL 25 MG PO TABS: 25.0000 mg | ORAL_TABLET | Freq: Four times a day (QID) | ORAL | Status: DC | PRN

## 2014-06-02 MED ORDER — SODIUM CHLORIDE 0.9 % IV SOLN
INTRAVENOUS | Status: DC
  Administered 2014-06-02: 21:00:00 via INTRAVENOUS

## 2014-06-02 MED ORDER — IOHEXOL 300 MG/ML  SOLN
100.0000 mL | Freq: Once | INTRAMUSCULAR | Status: AC | PRN
  Administered 2014-06-02: 100 mL via INTRAVENOUS

## 2014-06-02 MED ORDER — IOHEXOL 300 MG/ML  SOLN
50.0000 mL | Freq: Once | INTRAMUSCULAR | Status: AC | PRN
  Administered 2014-06-02: 50 mL via ORAL

## 2014-06-02 MED ORDER — HYDROMORPHONE HCL 1 MG/ML IJ SOLN
1.0000 mg | Freq: Once | INTRAMUSCULAR | Status: AC
  Administered 2014-06-02: 1 mg via INTRAVENOUS
  Filled 2014-06-02: qty 1

## 2014-06-02 MED ORDER — CIPROFLOXACIN IN D5W 400 MG/200ML IV SOLN
400.0000 mg | Freq: Once | INTRAVENOUS | Status: AC
  Administered 2014-06-02: 400 mg via INTRAVENOUS
  Filled 2014-06-02: qty 200

## 2014-06-02 NOTE — ED Notes (Signed)
Pt has been on macrobid since 11/17 after taking OTC UTI test which was positive- PCP called in Rx- now having fever, chills, pelvic pain, and vaginal d/c

## 2014-06-02 NOTE — Discharge Instructions (Signed)
°  Based on not CT scan and your clinical symptoms we'll treat as if pyelonephritis. Take the Cipro antibiotic as directed. Should be improved in 2 days. If not further evaluation for possible other causes to include pelvic infection would be important. Take the antibiotic as directed. Take pain medicine as needed. Take antinausea medicine as needed. Make an appointment to follow-up with your regular doctor.

## 2014-06-02 NOTE — ED Notes (Signed)
MD at bedside. 

## 2014-06-02 NOTE — ED Notes (Signed)
Pt reports she is currently being treated for urinary tract infection from last week. Starting Friday night, she started having increased blood in her urine, low grade fever, nausea and chills

## 2014-06-02 NOTE — ED Provider Notes (Signed)
CSN: [id number]     Arrival date & time 06/02/14  1940 History  This chart was scribed for Fredia Sorrow, MD by Peyton Bottoms, ED Scribe. This patient was seen in room MH05/MH05 and the patient's care was started at 9:05 PM.   Chief Complaint  Patient presents with  . Abdominal Pain   Patient is a 33 y.o. female presenting with abdominal pain. The history is provided by the patient. No language interpreter was used.  Abdominal Pain Pain location:  Suprapubic Pain quality: aching   Pain severity:  Moderate Onset quality:  Gradual Timing:  Constant Chronicity:  Recurrent Relieved by:  Nothing Worsened by:  Nothing tried Ineffective treatments: macrobid. Associated symptoms: dysuria, fever, hematuria, nausea and vomiting   Associated symptoms: no chest pain, no diarrhea, no shortness of breath and no sore throat     HPI Comments: Jenna Gray is a 33 y.o. female with a history of kidney stones, who presents to the Emergency Department complaining of pelvic pain that initially began a few days ago. She called Dr. Brigitte Pulse and was prescribed to take Macrobid since 11/17. She reports her pain initially improved but gradually worsened with associated body aches, nausea, multiple episodes of emesis, chills, fever, dysuria, hematuria, urinary frequency, and pelvic pain which began 3 nights ago. Patient is seen by Dr. [name] with [facility].  Past Medical History  Diagnosis Date  . History of ovarian cyst 02/2000    DERMOID, LEFT  . Migraine   . TMJ (temporomandibular joint syndrome)   . Fibromyalgia   . Anemia   . Right ureteral stone   . Hypothyroidism 05/2012  . Renal disorder    Past Surgical History  Procedure Laterality Date  . Cystectomy      OVARIAN DERMOID  . Appendectomy      AGE 23  . Tubal ligation  2010  . Lithotripsy     Family History  Problem Relation Age of Onset  . Hypertension Father   . Alcohol abuse Father   . Breast cancer Maternal Aunt   . Cancer  Paternal Grandmother     COLON , PANCREATIC  . Hyperlipidemia Mother    History  Substance Use Topics  . Smoking status: Never Smoker   . Smokeless tobacco: Never Used  . Alcohol Use: No   OB History    Gravida Para Term Preterm AB TAB SAB Ectopic Multiple Living   4 4        4      Review of Systems  Constitutional: Positive for fever.  HENT: Negative for congestion, rhinorrhea and sore throat.   Eyes: Negative for visual disturbance.  Respiratory: Negative for shortness of breath.   Cardiovascular: Negative for chest pain and leg swelling.  Gastrointestinal: Positive for nausea, vomiting and abdominal pain. Negative for diarrhea.  Genitourinary: Positive for dysuria, frequency and hematuria.  Musculoskeletal: Positive for myalgias and back pain.  Skin: Negative for rash.  Neurological: Positive for headaches (consistent with baseline).  Hematological: Does not bruise/bleed easily.  Psychiatric/Behavioral: Negative for confusion.   Allergies  Penicillins and Neurontin  Home Medications   Prior to Admission medications   Medication Sig Start Date End Date Taking? Authorizing Provider  butalbital-acetaminophen-caffeine (FIORICET, ESGIC) 50-325-40 MG per tablet TAKE 1 TO 2 TABLETS BY MOUTH EVERY 6 HOURS AS NEEDED FOR HEADACHE. 05/13/14  Yes Shawnee Knapp, MD  ergocalciferol (VITAMIN D2) 50000 UNITS capsule Take 1 capsule (50,000 Units total) by mouth once a week. 12/18/13  Yes  Shawnee Knapp, MD  ferrous sulfate 325 (65 FE) MG tablet Take 1 tablet by mouth 2 (two) times daily.   Yes Historical Provider, MD  ibuprofen (ADVIL,MOTRIN) 200 MG tablet Take 200-800 mg by mouth every 6 (six) hours as needed for pain. Headache or pain   Yes Historical Provider, MD  nitrofurantoin, macrocrystal-monohydrate, (MACROBID) 100 MG capsule Take 1 capsule (100 mg total) by mouth 2 (two) times daily. 05/28/14  Yes Shawnee Knapp, MD  traMADol (ULTRAM) 50 MG tablet Take 1-2 tablets (50-100 mg total) by mouth  every 6 (six) hours as needed. Max 8 tabs/day. Do not combine with ambien. 03/22/14  Yes Shawnee Knapp, MD  zolpidem (AMBIEN) 5 MG tablet Take 1 tablet (5 mg total) by mouth at bedtime as needed for sleep. 03/22/14  Yes Shawnee Knapp, MD  ciprofloxacin (CIPRO) 500 MG tablet Take 1 tablet (500 mg total) by mouth 2 (two) times daily. 06/02/14   Fredia Sorrow, MD  diltiazem (CARDIZEM) 30 MG tablet Take 1 tablet (30 mg total) by mouth 4 (four) times daily as needed (elevated blood pressure >140/90). 03/22/14   Shawnee Knapp, MD  HYDROcodone-acetaminophen (NORCO/VICODIN) 5-325 MG per tablet Take 1-2 tablets by mouth every 6 (six) hours as needed for moderate pain. 06/02/14   Fredia Sorrow, MD  pantoprazole (PROTONIX) 40 MG tablet Take 1 tablet (40 mg total) by mouth daily. 09/21/13   Thao P Le, DO  Prenatal Vit-Fe Fumarate-FA (PRENATAL MULTIVITAMIN) TABS Take 1 tablet by mouth daily.    Historical Provider, MD  promethazine (PHENERGAN) 25 MG tablet Take 1 tablet (25 mg total) by mouth every 6 (six) hours as needed for nausea or vomiting. 06/02/14   Fredia Sorrow, MD   Triage Vitals: BP 162/100 mmHg  Pulse 89  Temp(Src) 100.3 F (37.9 C) (Oral)  Resp 16  Ht 5\' 1"  (1.549 m)  Wt 145 lb (65.772 kg)  BMI 27.41 kg/m2  SpO2 96%  LMP 05/21/2014  Physical Exam  Constitutional: She appears well-developed and well-nourished.  HENT:  Head: Normocephalic and atraumatic.  Mouth/Throat: Oropharynx is clear and moist.  Eyes: Pupils are equal, round, and reactive to light.  Cardiovascular: Normal rate, regular rhythm and normal heart sounds.   No murmur heard. Pulmonary/Chest: Effort normal and breath sounds normal. No respiratory distress.  Abdominal: Soft. Bowel sounds are normal. There is no tenderness.  Musculoskeletal: She exhibits no edema.  Neurological: She is alert. No cranial nerve deficit. She exhibits normal muscle tone. Coordination normal.  Skin: No rash noted.  Psychiatric: She has a normal mood  and affect.  Nursing note and vitals reviewed.  ED Course  Procedures (including critical care time)  Medications  0.9 %  sodium chloride infusion ( Intravenous Stopped 06/02/14 2311)  ciprofloxacin (CIPRO) IVPB 400 mg (400 mg Intravenous New Bag/Given 06/02/14 2309)  sodium chloride 0.9 % bolus 500 mL (0 mLs Intravenous Stopped 06/02/14 2227)  ondansetron (ZOFRAN) injection 4 mg (4 mg Intravenous Given 06/02/14 2125)  HYDROmorphone (DILAUDID) injection 1 mg (1 mg Intravenous Given 06/02/14 2125)  iohexol (OMNIPAQUE) 300 MG/ML solution 50 mL (50 mLs Oral Contrast Given 06/02/14 2121)  iohexol (OMNIPAQUE) 300 MG/ML solution 100 mL (100 mLs Intravenous Contrast Given 06/02/14 2224)   DIAGNOSTIC STUDIES: Oxygen Saturation is 98% on RA, normal by my interpretation.    COORDINATION OF CARE: 9:13 PM- Discussed plans to obtain diagnostic urinalysis, pregnancy test and lab work. Will give patient Zofran, Dilaudid and IV fluids. Pt advised of  plan for treatment and pt agrees.  Results for orders placed or performed during the hospital encounter of 06/02/14  Urinalysis, Routine w reflex microscopic  Result Value Ref Range   Color, Urine YELLOW YELLOW   APPearance CLEAR CLEAR   Specific Gravity, Urine 1.017 1.005 - 1.030   pH 7.5 5.0 - 8.0   Glucose, UA NEGATIVE NEGATIVE mg/dL   Hgb urine dipstick NEGATIVE NEGATIVE   Bilirubin Urine NEGATIVE NEGATIVE   Ketones, ur NEGATIVE NEGATIVE mg/dL   Protein, ur NEGATIVE NEGATIVE mg/dL   Urobilinogen, UA 0.2 0.0 - 1.0 mg/dL   Nitrite NEGATIVE NEGATIVE   Leukocytes, UA NEGATIVE NEGATIVE  Pregnancy, urine  Result Value Ref Range   Preg Test, Ur NEGATIVE NEGATIVE  CBC with Differential  Result Value Ref Range   WBC 8.1 4.0 - 10.5 K/uL   RBC 4.51 3.87 - 5.11 MIL/uL   Hemoglobin 8.8 (L) 12.0 - 15.0 g/dL   HCT 30.8 (L) 36.0 - 46.0 %   MCV 68.3 (L) 78.0 - 100.0 fL   MCH 19.5 (L) 26.0 - 34.0 pg   MCHC 28.6 (L) 30.0 - 36.0 g/dL   RDW 18.3 (H) 11.5 -  15.5 %   Platelets 314 150 - 400 K/uL   Neutrophils Relative % 65 43 - 77 %   Lymphocytes Relative 20 12 - 46 %   Monocytes Relative 11 3 - 12 %   Eosinophils Relative 4 0 - 5 %   Basophils Relative 0 0 - 1 %   Neutro Abs 5.3 1.7 - 7.7 K/uL   Lymphs Abs 1.6 0.7 - 4.0 K/uL   Monocytes Absolute 0.9 0.1 - 1.0 K/uL   Eosinophils Absolute 0.3 0.0 - 0.7 K/uL   Basophils Absolute 0.0 0.0 - 0.1 K/uL   RBC Morphology STOMATOCYTES    WBC Morphology WHITE COUNT CONFIRMED ON SMEAR    Smear Review PLATELET COUNT CONFIRMED BY SMEAR   Comprehensive metabolic panel  Result Value Ref Range   Sodium 137 137 - 147 mEq/L   Potassium 3.9 3.7 - 5.3 mEq/L   Chloride 102 96 - 112 mEq/L   CO2 24 19 - 32 mEq/L   Glucose, Bld 103 (H) 70 - 99 mg/dL   BUN 12 6 - 23 mg/dL   Creatinine, Ser 0.80 0.50 - 1.10 mg/dL   Calcium 8.7 8.4 - 10.5 mg/dL   Total Protein 7.7 6.0 - 8.3 g/dL   Albumin 3.5 3.5 - 5.2 g/dL   AST 28 0 - 37 U/L   ALT 26 0 - 35 U/L   Alkaline Phosphatase 86 39 - 117 U/L   Total Bilirubin <0.2 (L) 0.3 - 1.2 mg/dL   GFR calc non Af Amer >90 >90 mL/min   GFR calc Af Amer >90 >90 mL/min   Anion gap 11 5 - 15  Lipase, blood  Result Value Ref Range   Lipase 52 11 - 59 U/L   Ct Abdomen Pelvis W Contrast  06/02/2014   CLINICAL DATA:  Generalized abdominal pain.  EXAM: CT ABDOMEN AND PELVIS WITH CONTRAST  TECHNIQUE: Multidetector CT imaging of the abdomen and pelvis was performed using the standard protocol following bolus administration of intravenous contrast.  CONTRAST:  22mL OMNIPAQUE IOHEXOL 300 MG/ML SOLN, 17mL OMNIPAQUE IOHEXOL 300 MG/ML SOLN  COMPARISON:  09/30/2013  FINDINGS: BODY WALL: Unremarkable.  LOWER CHEST: Unremarkable.  ABDOMEN/PELVIS:  Liver: No focal abnormality.  Biliary: No evidence of biliary obstruction or stone.  Pancreas: Unremarkable.  Spleen: Unremarkable.  Adrenals: Unremarkable.  Kidneys and ureters: Fullness of the bilateral urinary collecting system which is likely  related to distended urinary bladder. No ureteral calculus or other obstructive process identified. There is subtle hypoenhancement involving the interpolar anterior left renal cortex, with mild surrounding perinephric fat infiltration. Fat infiltration is new compared to noncontrast study 09/30/2013. No abscess.  Right renal cortical thinning.  Bladder: Distended.  No wall thickening.  Reproductive: Bilateral tubal ligation.  Bowel: No obstruction. No evidence of bowel inflammation. Appendectomy.  Retroperitoneum: No mass or adenopathy.  Peritoneum: No ascites or pneumoperitoneum.  Vascular: No acute abnormality.  OSSEOUS: No acute abnormalities.  IMPRESSION: 1. Suspect mild left pyelonephritis.  No hydronephrosis. 2. Distended urinary bladder.   Electronically Signed   By: Jorje Guild M.D.   On: 06/02/2014 22:48    MDM   Final diagnoses:  Abdominal pain  Pyelonephritis    Patient with significant urinary type symptoms. Was treated with Macrobid for UTI. Urinalysis however is negative. Clinically patient's symptoms and CT findings are consistent. Patient with back pain patient with the lower abdominal pain patient with lots of dysuria and urinary frequency. Patient states she does have some vaginal discharge which comes and goes but nothing that was alarming. Patient received Cipro IV here will continue a course of Cipro as if this could be pyelonephritis. Patient has allergy to penicillin so Rocephin was not used. If not better in 2 days consideration for other causes including possible pelvic infection should be further evaluated. Patient will be treated with Cipro she will follow-up if not better in 2 days. Patient nontoxic no acute distress.  I personally performed the services described in this documentation, which was scribed in my presence. The recorded information has been reviewed and is accurate.  Fredia Sorrow, MD 06/02/14 2342

## 2014-06-02 NOTE — ED Notes (Signed)
Pt c/o nausea and pain. Dr. Rogene Houston made aware.

## 2014-06-10 ENCOUNTER — Other Ambulatory Visit: Payer: Self-pay | Admitting: Family Medicine

## 2014-06-10 NOTE — Telephone Encounter (Signed)
Pt was given 3 mos supply on 03/22/14 and she has an appt on 06/21/14  - i will refill her tramadol at that time - no early refill.

## 2014-06-12 ENCOUNTER — Ambulatory Visit (INDEPENDENT_AMBULATORY_CARE_PROVIDER_SITE_OTHER): Payer: 59 | Admitting: Family Medicine

## 2014-06-12 VITALS — BP 90/60 | HR 75 | Temp 98.2°F | Resp 16 | Ht 62.0 in | Wt 151.0 lb

## 2014-06-12 DIAGNOSIS — B3731 Acute candidiasis of vulva and vagina: Secondary | ICD-10-CM

## 2014-06-12 DIAGNOSIS — D509 Iron deficiency anemia, unspecified: Secondary | ICD-10-CM

## 2014-06-12 DIAGNOSIS — R109 Unspecified abdominal pain: Secondary | ICD-10-CM

## 2014-06-12 DIAGNOSIS — R110 Nausea: Secondary | ICD-10-CM

## 2014-06-12 DIAGNOSIS — N898 Other specified noninflammatory disorders of vagina: Secondary | ICD-10-CM

## 2014-06-12 DIAGNOSIS — R1031 Right lower quadrant pain: Secondary | ICD-10-CM

## 2014-06-12 DIAGNOSIS — B373 Candidiasis of vulva and vagina: Secondary | ICD-10-CM

## 2014-06-12 LAB — POCT WET PREP WITH KOH
Clue Cells Wet Prep HPF POC: NEGATIVE
KOH PREP POC: POSITIVE
Trichomonas, UA: NEGATIVE
Yeast Wet Prep HPF POC: NEGATIVE

## 2014-06-12 LAB — POCT CBC
Granulocyte percent: 57.2 %G (ref 37–80)
HCT, POC: 31.2 % — AB (ref 37.7–47.9)
Hemoglobin: 9.2 g/dL — AB (ref 12.2–16.2)
Lymph, poc: 2.3 (ref 0.6–3.4)
MCH, POC: 19.4 pg — AB (ref 27–31.2)
MCHC: 29.5 g/dL — AB (ref 31.8–35.4)
MCV: 65.7 fL — AB (ref 80–97)
MID (CBC): 0.4 (ref 0–0.9)
MPV: 8.6 fL (ref 0–99.8)
PLATELET COUNT, POC: 415 10*3/uL (ref 142–424)
POC GRANULOCYTE: 3.6 (ref 2–6.9)
POC LYMPH PERCENT: 36.2 %L (ref 10–50)
POC MID %: 6.6 %M (ref 0–12)
RBC: 4.74 M/uL (ref 4.04–5.48)
RDW, POC: 19 %
WBC: 6.3 10*3/uL (ref 4.6–10.2)

## 2014-06-12 LAB — POCT URINALYSIS DIPSTICK
Bilirubin, UA: NEGATIVE
Blood, UA: NEGATIVE
GLUCOSE UA: NEGATIVE
KETONES UA: NEGATIVE
LEUKOCYTES UA: NEGATIVE
Nitrite, UA: NEGATIVE
Protein, UA: NEGATIVE
Spec Grav, UA: <=1.005
Urobilinogen, UA: 0.2
pH, UA: 6

## 2014-06-12 LAB — POCT UA - MICROSCOPIC ONLY
BACTERIA, U MICROSCOPIC: NEGATIVE
Casts, Ur, LPF, POC: NEGATIVE
Crystals, Ur, HPF, POC: NEGATIVE
MUCUS UA: NEGATIVE
RBC, urine, microscopic: NEGATIVE
YEAST UA: NEGATIVE

## 2014-06-12 MED ORDER — FLUCONAZOLE 150 MG PO TABS: 150.0000 mg | ORAL_TABLET | Freq: Once | ORAL | Status: DC

## 2014-06-12 NOTE — Progress Notes (Addendum)
This chart was scribed for Jenna Ray, MD by Jenna Gray, ED Scribe. This patient was seen in room 1 and the patient's care was started at 3:29 PM.   Subjective:    Patient ID: Jenna Gray, female    DOB: 1980/07/22, 33 y.o.   MRN: [id number]  Chief Complaint  Patient presents with  . Pyelonephritis    x2-3 weeks ago;  started off with UTI was given meds but still is not feeling better    HPI  HPI Comments: Jenna Gray is a 33 y.o. female with history of kidney stones who presents to the [facility] complaining of pyelonephritis. She had been treated with Macrobid on 11/17 by home UTI test. Presented to the ED on 11/22 with abdominal pain and pelvic pain. Had initally had some improvement with Macrobid but then worsened with nausea, vomiting, hematuria, body aches, Her UA was negative, HCG negative, CBC drawn with WBC 8.1, hemaglobin 8.8, normal CMP with creatinine .080 and normal lipase. She had a CT scan of abdomen and pelvis that indicated suspected mild left pyelonephritis and a distended urinary bladder. She was treated with Cipro IV then course of Cipro PO and recommended to f/u if not better in 2 days. Of note, her hemoglobin over the past 6 months has ranged from 9.4 to 10.2. She has a history of iron deficiency anemia.  She states she had UTI symptoms initially and used Azo, then stopped it when she called Dr. Brigitte Gray who called in [place]. She states she had fever and body aches 5-6 days after starting treatment so she went to the ED. She states she has had improvement on Cipro from the ED: body aches improved some, but still has pelvic and lower abdominal pain, worse on the right. Note that her CT revealed infection on her left. She states she is still taking her Cipro but has just a few days left. She states she has been compliant with her prescription. She reports associated vaginal discharge and nausea but denies vomiting. She states dysuria and  hematuria have resolved. She reports lightheadedness. She states she has been drinking fluids normally. She states she is married and sexually active, but denies new partners or extramarital partners. She denies history of STIs. She states her last cycle was on 11/10 and was normal. She states she has an appointment with urology in 2 days.  PCP: Jenna Cheadle, MD  Patient Active Problem List   Diagnosis Date Noted  . Vitamin D deficiency 12/18/2013  . 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 Menorrhagia 12/14/2013  . Myofascial muscle pain 04/14/2012  . Iron deficiency anemia 04/14/2012  . Anxiety 11/22/2011  . Fibromyalgia 11/22/2011  . Plantar fasciitis 11/22/2011  . General medical examination 09/22/2011  . GERD (gastroesophageal reflux disease) 09/22/2011  . Insomnia 08/20/2011   Past Medical History  Diagnosis Date  . History of ovarian cyst 02/2000    DERMOID, LEFT  . Migraine   . TMJ (temporomandibular joint syndrome)   . Fibromyalgia   . Anemia   . Right ureteral stone   . Hypothyroidism 05/2012  . Renal disorder    Past Surgical History  Procedure Laterality Date  . Cystectomy      OVARIAN DERMOID  . Appendectomy      AGE 55  . Tubal ligation  2010  . Lithotripsy     Allergies  Allergen Reactions  . Penicillins Hives and Itching  . Neurontin [Gabapentin]  headache   Prior to Admission medications   Medication Sig Start Date End Date Taking? Authorizing Provider  butalbital-acetaminophen-caffeine (FIORICET, ESGIC) 50-325-40 MG per tablet TAKE 1 TO 2 TABLETS BY MOUTH EVERY 6 HOURS AS NEEDED FOR HEADACHE. 05/13/14  Yes Shawnee Knapp, MD  ciprofloxacin (CIPRO) 500 MG tablet Take 1 tablet (500 mg total) by mouth 2 (two) times daily. 06/02/14  Yes Fredia Sorrow, MD  diltiazem (CARDIZEM) 30 MG tablet Take 1 tablet (30 mg total) by mouth 4 (four) times daily as needed (elevated blood pressure >140/90). 03/22/14  Yes Shawnee Knapp, MD  ergocalciferol (VITAMIN D2) 50000 UNITS  capsule Take 1 capsule (50,000 Units total) by mouth once a week. 12/18/13  Yes Shawnee Knapp, MD  ferrous sulfate 325 (65 FE) MG tablet Take 1 tablet by mouth 2 (two) times daily.   Yes Historical Provider, MD  ibuprofen (ADVIL,MOTRIN) 200 MG tablet Take 200-800 mg by mouth every 6 (six) hours as needed for pain. Headache or pain   Yes Historical Provider, MD  promethazine (PHENERGAN) 25 MG tablet Take 1 tablet (25 mg total) by mouth every 6 (six) hours as needed for nausea or vomiting. 06/02/14  Yes Fredia Sorrow, MD  traMADol (ULTRAM) 50 MG tablet Take 1-2 tablets (50-100 mg total) by mouth every 6 (six) hours as needed. Max 8 tabs/day. Do not combine with ambien. 03/22/14  Yes Shawnee Knapp, MD  zolpidem (AMBIEN) 5 MG tablet Take 1 tablet (5 mg total) by mouth at bedtime as needed for sleep. 03/22/14  Yes Shawnee Knapp, MD   History   Social History  . Marital Status: Married    Spouse Name: N/A    Number of Children: N/A  . Years of Education: N/A   Occupational History  . Not on file.   Social History Main Topics  . Smoking status: Never Smoker   . Smokeless tobacco: Never Used  . Alcohol Use: No  . Drug Use: No  . Sexual Activity:    Partners: Male    Birth Control/ Protection: Surgical   Other Topics Concern  . Not on file   Social History Narrative      Review of Systems  Constitutional: Positive for fever.  Gastrointestinal: Positive for nausea and abdominal pain. Negative for vomiting.  Genitourinary: Positive for vaginal discharge and pelvic pain. Negative for dysuria and hematuria.  Musculoskeletal: Positive for myalgias.  Neurological: Positive for light-headedness.       Objective:   Physical Exam  Constitutional: She is oriented to person, place, and time. She appears well-developed and well-nourished.  HENT:  Head: Normocephalic and atraumatic.  Eyes: Conjunctivae are normal.  Neck: Normal range of motion. Neck supple.  Cardiovascular: Normal rate and normal  heart sounds.  Exam reveals no gallop and no friction rub.   No murmur heard. Pulmonary/Chest: Effort normal.  Abdominal: Soft. There is no tenderness. There is no rebound and no guarding.  Genitourinary: There is no rash, tenderness or lesion on the right labia. There is no rash, tenderness or lesion on the left labia. Cervix exhibits no motion tenderness and no friability. Right adnexum displays no mass, no tenderness and no fullness. Left adnexum displays no mass, no tenderness and no fullness. There is bleeding (minimal blood at cervical os (hx of breakthrough bleeding per pt). ) in the vagina. No vaginal discharge found.    No cvat.  Musculoskeletal: Normal range of motion.  Slight tenderness on right paraspinal muscles No CVA tenderness  Neurological: She is alert and oriented to person, place, and time.  Skin: Skin is warm and dry.  Psychiatric: She has a normal mood and affect.  Nursing note and vitals reviewed.   Filed Vitals:   06/12/14 1450  BP: 90/60  Gray: 75  Temp: 98.2 F (36.8 C)  TempSrc: Oral  Resp: 16  Height: 5\' 2"  (1.575 m)  Weight: 151 lb (68.493 kg)  SpO2: 100%   Orthostatic reviewed. Overall within normal limits except heart rate increased from 67 to 78 with standing.   Results for orders placed or performed in visit on 06/12/14  POCT urinalysis dipstick  Result Value Ref Range   Color, UA yellow    Clarity, UA slightly cloudy    Glucose, UA neg    Bilirubin, UA neg    Ketones, UA neg    Spec Grav, UA <=1.005    Blood, UA neg    pH, UA 6.0    Protein, UA neg    Urobilinogen, UA 0.2    Nitrite, UA neg    Leukocytes, UA Negative   POCT UA - Microscopic Only  Result Value Ref Range   WBC, Ur, HPF, POC 0-1    RBC, urine, microscopic neg    Bacteria, U Microscopic neg    Mucus, UA neg    Epithelial cells, urine per micros 1-5    Crystals, Ur, HPF, POC neg    Casts, Ur, LPF, POC neg    Yeast, UA neg   POCT CBC  Result Value Ref Range   WBC  6.3 4.6 - 10.2 K/uL   Lymph, poc 2.3 0.6 - 3.4   POC LYMPH PERCENT 36.2 10 - 50 %L   MID (cbc) 0.4 0 - 0.9   POC MID % 6.6 0 - 12 %M   POC Granulocyte 3.6 2 - 6.9   Granulocyte percent 57.2 37 - 80 %G   RBC 4.74 4.04 - 5.48 M/uL   Hemoglobin 9.2 (A) 12.2 - 16.2 g/dL   HCT, POC 31.2 (A) 37.7 - 47.9 %   MCV 65.7 (A) 80 - 97 fL   MCH, POC 19.4 (A) 27 - 31.2 pg   MCHC 29.5 (A) 31.8 - 35.4 g/dL   RDW, POC 19.0 %   Platelet Count, POC 415 142 - 424 K/uL   MPV 8.6 0 - 99.8 fL  POCT Wet Prep with KOH  Result Value Ref Range   Trichomonas, UA Negative    Clue Cells Wet Prep HPF POC neg    Epithelial Wet Prep HPF POC 6-9    Yeast Wet Prep HPF POC neg    Bacteria Wet Prep HPF POC trace    RBC Wet Prep HPF POC 2-11    WBC Wet Prep HPF POC 1-2    KOH Prep POC Positive        Assessment & Plan:   Jenna Gray is a 33 y.o. female Right flank pain - Plan: POCT urinalysis dipstick, POCT UA - Microscopic Only, Suprapubic abdominal pain, right Nausea - Plan: POCT urinalysis dipstick, POCT UA - Microscopic Only, Vaginal discharge - Plan: POCT urinalysis dipstick, POCT UA - Microscopic Only, POCT Wet Prep with KOH, GC/Chlamydia Probe Amp  -prior pyelo by CT. Reassuring U/a and CBC in office.  Finishing Cipro.  No changes at this time as some improvement, but can follow up with her urologist as planned in 2 days if still symptomatic.   Iron deficiency anemia - Plan: POCT CBC  -  increase iron to BID, and follow up with PCP in next 6 weeks to discuss further. Sooner if increasing fatigue, or other symptoms of anemia.   Candida vaginitis - Plan: fluconazole (DIFLUCAN) 150 MG tablet  -early. Rx diflucan to take at end of abx course.   rtc precautions discussed.   Meds ordered this encounter  Medications  . fluconazole (DIFLUCAN) 150 MG tablet    Sig: Take 1 tablet (150 mg total) by mouth once.    Dispense:  1 tablet    Refill:  0   Patient Instructions  Your urine test appears ok today,  and 10 days of Cipro should have been sufficient for possible kidney infection. Keep follow up with urologist this Friday if needed.  Increase iron to 2 per day and follow up with Dr. Brigitte Gray in next 6 weeks. Stool softener may be needed with twice daily iron.  There may be an early yeast infection, so I prescribed Diflucan once. You should receive a call or letter about your lab results within the next week to 10 days.  Return to the clinic or go to the nearest emergency room if any of your symptoms worsen or new symptoms occur.      I personally performed the services described in this documentation, which was scribed in my presence. The recorded information has been reviewed and considered, and addended by me as needed.

## 2014-06-12 NOTE — Patient Instructions (Signed)
Your urine test appears ok today, and 10 days of Cipro should have been sufficient for possible kidney infection. Keep follow up with urologist this Friday if needed.  Increase iron to 2 per day and follow up with Dr. Brigitte Pulse in next 6 weeks. Stool softener may be needed with twice daily iron.  There may be an early yeast infection, so I prescribed Diflucan once. You should receive a call or letter about your lab results within the next week to 10 days.  Return to the clinic or go to the nearest emergency room if any of your symptoms worsen or new symptoms occur.

## 2014-06-13 LAB — GC/CHLAMYDIA PROBE AMP
CT PROBE, AMP APTIMA: NEGATIVE
GC Probe RNA: NEGATIVE

## 2014-06-21 ENCOUNTER — Ambulatory Visit (INDEPENDENT_AMBULATORY_CARE_PROVIDER_SITE_OTHER): Payer: 59 | Admitting: Family Medicine

## 2014-06-21 VITALS — BP 140/90 | HR 102 | Temp 98.5°F | Resp 18 | Ht 61.0 in | Wt 151.0 lb

## 2014-06-21 DIAGNOSIS — R109 Unspecified abdominal pain: Secondary | ICD-10-CM

## 2014-06-21 LAB — POCT UA - MICROSCOPIC ONLY
Bacteria, U Microscopic: NEGATIVE
CASTS, UR, LPF, POC: NEGATIVE
CRYSTALS, UR, HPF, POC: NEGATIVE
Mucus, UA: POSITIVE
WBC, Ur, HPF, POC: NEGATIVE
Yeast, UA: NEGATIVE

## 2014-06-21 LAB — POCT URINALYSIS DIPSTICK
Bilirubin, UA: NEGATIVE
Blood, UA: NEGATIVE
GLUCOSE UA: NEGATIVE
Ketones, UA: NEGATIVE
LEUKOCYTES UA: NEGATIVE
NITRITE UA: NEGATIVE
PROTEIN UA: NEGATIVE
Spec Grav, UA: 1.02
UROBILINOGEN UA: 0.2
pH, UA: 6

## 2014-06-21 MED ORDER — BUTALBITAL-APAP-CAFFEINE 50-325-40 MG PO TABS: ORAL_TABLET | ORAL | Status: DC

## 2014-06-21 MED ORDER — ZOLPIDEM TARTRATE 5 MG PO TABS: 5.0000 mg | ORAL_TABLET | Freq: Every evening | ORAL | Status: DC | PRN

## 2014-06-21 MED ORDER — PANTOPRAZOLE SODIUM 40 MG PO TBEC: 40.0000 mg | DELAYED_RELEASE_TABLET | Freq: Every day | ORAL | Status: DC

## 2014-06-21 MED ORDER — TRAMADOL HCL 50 MG PO TABS: 50.0000 mg | ORAL_TABLET | Freq: Four times a day (QID) | ORAL | Status: DC | PRN

## 2014-06-21 NOTE — Progress Notes (Signed)
Subjective:    Patient ID: Jenna Gray, female    DOB: [birth year], 33 y.o.   MRN: [id number] This chart was scribed for [name], MD by Zola Button, Medical Scribe. This patient was seen in Room 29 and the patient's care was started at 4:39 PM.  Chief Complaint  Patient presents with  . Medication Refill    pended Fioricet, Tramadol, Ambien  . Hypertension    states better on new headache meds  . Gastrophageal Reflux    would like protonix/ pended ( took previously)     HPI HPI Comments: Jenna Gray is a 33 y.o. female who presents to the [facility] for a medication refill.  Patient was seen 3 weeks ago for culture negative pyelonephritis. This was complicated by a course of Macrobid. She was then seen in clinic again last week. Her pyelonephritis was treated by Cipro and had an appointment with her urologist. She was instructed to increase her iron supplement to BID and was given fluconazole.  She still has some pain on her right flank occasionally where she had the infection. Urology rescheduled, but she will see them next week.  Patient has seen some improvement with her blood pressure. She has been getting readings of 150-160/90-100. Patient reports having HA about 2-3 times per week. Her Fioricet usually resolves her HA, only needing to take it no more than once a day.  Past Medical History  Diagnosis Date  . History of ovarian cyst 02/2000    DERMOID, LEFT  . Migraine   . TMJ (temporomandibular joint syndrome)   . Fibromyalgia   . Anemia   . Right ureteral stone   . Hypothyroidism 05/2012  . Renal disorder    Current Outpatient Prescriptions on File Prior to Visit  Medication Sig Dispense Refill  . diltiazem (CARDIZEM) 30 MG tablet Take 1 tablet (30 mg total) by mouth 4 (four) times daily as needed (elevated blood pressure >140/90). 120 tablet 0  . ergocalciferol (VITAMIN D2) 50000 UNITS capsule Take 1 capsule (50,000 Units total) by mouth  once a week. 4 capsule 5  . ferrous sulfate 325 (65 FE) MG tablet Take 1 tablet by mouth 2 (two) times daily.    Marland Kitchen ibuprofen (ADVIL,MOTRIN) 200 MG tablet Take 200-800 mg by mouth every 6 (six) hours as needed for pain. Headache or pain     No current facility-administered medications on file prior to visit.   Allergies  Allergen Reactions  . Penicillins Hives and Itching  . Neurontin [Gabapentin]     headache     Review of Systems  Genitourinary: Positive for flank pain.  Neurological: Positive for headaches.       Objective:  BP 140/90 mmHg  Pulse 102  Temp(Src) 98.5 F (36.9 C)  Resp 18  Ht 5\' 1"  (1.549 m)  Wt 151 lb (68.493 kg)  BMI 28.55 kg/m2  SpO2 98%  LMP 05/21/2014  Physical Exam  Constitutional: She is oriented to person, place, and time. She appears well-developed and well-nourished. No distress.  HENT:  Head: Normocephalic and atraumatic.  Mouth/Throat: Oropharynx is clear and moist. No oropharyngeal exudate.  Eyes: Pupils are equal, round, and reactive to light.  Neck: Neck supple.  Cardiovascular: Regular rhythm, S1 normal and S2 normal.  Tachycardia present.   Murmur heard.  Diastolic murmur is present with a grade of 2/6  2+ diastolic murmur, right upper sternal border.  Pulmonary/Chest: Effort normal and breath sounds normal. No respiratory  distress. She has no wheezes. She has no rales.  Musculoskeletal: She exhibits no edema.  Neurological: She is alert and oriented to person, place, and time. No cranial nerve deficit.  Skin: Skin is warm and dry. No rash noted.  Psychiatric: She has a normal mood and affect. Her behavior is normal.  Nursing note and vitals reviewed.         Assessment & Plan:   Right flank pain - Plan: POCT UA - Microscopic Only, POCT urinalysis dipstick, Urine culture Unknown etiology but hopefully just lingering from inflammation from recent pyelo. Cont with watchful waiting and push fluids.  High fiber to avoid  constipation.  If pain continues, would recommend US/imaging of kidneys due to recent prolonged infection.  Meds ordered this encounter  Medications  . butalbital-acetaminophen-caffeine (FIORICET, ESGIC) 50-325-40 MG per tablet    Sig: TAKE 1 TO 2 TABLETS BY MOUTH EVERY 6 HOURS AS NEEDED FOR HEADACHE.    Dispense:  20 tablet    Refill:  1  . traMADol (ULTRAM) 50 MG tablet    Sig: Take 1-2 tablets (50-100 mg total) by mouth every 6 (six) hours as needed. Max 8 tabs/day. Do not combine with ambien.    Dispense:  240 tablet    Refill:  2  . zolpidem (AMBIEN) 5 MG tablet    Sig: Take 1 tablet (5 mg total) by mouth at bedtime as needed for sleep.    Dispense:  15 tablet    Refill:  2  . pantoprazole (PROTONIX) 40 MG tablet    Sig: Take 1 tablet (40 mg total) by mouth daily.    Dispense:  90 tablet    Refill:  3    I personally performed the services described in this documentation, which was scribed in my presence. The recorded information has been reviewed and considered, and addended by me as needed.  [name], MD MPH   Results for orders placed or performed in visit on 06/21/14  Urine culture  Result Value Ref Range   Colony Count NO GROWTH    Organism ID, Bacteria NO GROWTH   POCT UA - Microscopic Only  Result Value Ref Range   WBC, Ur, HPF, POC Negative    RBC, urine, microscopic 0-1    Bacteria, U Microscopic Negative    Mucus, UA Positive    Epithelial cells, urine per micros 0-1    Crystals, Ur, HPF, POC Negative    Casts, Ur, LPF, POC Negative    Yeast, UA Negative   POCT urinalysis dipstick  Result Value Ref Range   Color, UA Yellow    Clarity, UA Clear    Glucose, UA Negative    Bilirubin, UA Negative    Ketones, UA Negative    Spec Grav, UA 1.020    Blood, UA Negative    pH, UA 6.0    Protein, UA negative    Urobilinogen, UA 0.2    Nitrite, UA Negative    Leukocytes, UA Negative

## 2014-06-23 LAB — URINE CULTURE
COLONY COUNT: NO GROWTH
Organism ID, Bacteria: NO GROWTH

## 2014-06-25 ENCOUNTER — Encounter: Payer: Self-pay | Admitting: Family Medicine

## 2014-06-25 NOTE — Progress Notes (Signed)
LM for patient to call back if appointment on 08/23/14 @ 10 am with Dr Brigitte Pulse if it doesn't work with her schedule.

## 2014-07-29 ENCOUNTER — Telehealth: Payer: Self-pay | Admitting: Family Medicine

## 2014-07-29 NOTE — Telephone Encounter (Signed)
FMLA on this patient was received on 07/29/2014. Called and LM for patient to see what specific condition she needs the [facility] paperwork completed for. Last OV with Dr. Brigitte Pulse was 06/25/2014. Double checked in [place] to make sure this is not a Worker's Comp case and it is not. Waiting for patient to return call with more information. I have placed the paperwork in Dr. Raul Del box for the time being and will update phone message when patient calls back. Please return to [facility] tray at checkout upon the 5-7 business day completion.   Thanks, [employer]

## 2014-07-31 NOTE — Telephone Encounter (Signed)
Thanks Jasmine.  I also reviewed pt's chart and pt's FMLA papers and cannot complete w/o additional information from pt.  Are these FMLA papers for when she was out of work with the kidney and urinary tract infection?  Or are these for when she needs to miss work for her chronic headaches going forward?  If it is for future - how much time does she think she will need off? 2 days a month???  Forms still in my box waiting on additional info for completion.

## 2014-08-14 ENCOUNTER — Telehealth: Payer: Self-pay | Admitting: Family Medicine

## 2014-08-14 NOTE — Telephone Encounter (Signed)
Patient called and states that FMLA is for her kidney and urinary tract infection.  [phone]

## 2014-08-16 ENCOUNTER — Encounter: Payer: Self-pay | Admitting: Family Medicine

## 2014-08-19 ENCOUNTER — Ambulatory Visit (INDEPENDENT_AMBULATORY_CARE_PROVIDER_SITE_OTHER): Payer: 59 | Admitting: Family Medicine

## 2014-08-19 VITALS — BP 130/90 | HR 79 | Temp 98.5°F | Resp 16 | Ht 61.5 in | Wt 141.0 lb

## 2014-08-19 DIAGNOSIS — G43101 Migraine with aura, not intractable, with status migrainosus: Secondary | ICD-10-CM

## 2014-08-19 MED ORDER — ONDANSETRON 4 MG PO TBDP
8.0000 mg | ORAL_TABLET | Freq: Once | ORAL | Status: AC
  Administered 2014-08-19: 8 mg via ORAL

## 2014-08-19 MED ORDER — KETOROLAC TROMETHAMINE 30 MG/ML IJ SOLN
30.0000 mg | Freq: Once | INTRAMUSCULAR | Status: AC
  Administered 2014-08-19: 30 mg via INTRAMUSCULAR

## 2014-08-19 NOTE — Patient Instructions (Signed)
Please let me know if your headache does not go away!  Call, come back in or go to the ER if your headache is severe

## 2014-08-19 NOTE — Progress Notes (Signed)
[facility] [address], [place] [address] [phone]  Date:  08/19/2014   Name:  Jenna Gray   DOB:  [birth year]   MRN:  [id number]  PCP:  [name], MD    Chief Complaint: Migraine   History of Present Illness:  Jenna Gray is a 34 y.o. very pleasant female patient who presents with the following:  She is here today with a migraine headache.   This seems like a typicla migraine for her, but she has had sx since Friday (today is Monday). She is nauseated, vomited once on Friday.  Her last migraine was a year or so ago.  She does get aura.  No head injury.  At home she has tried fioricet, sumatriptan.  She still takes tramadol for chronic pain- she takes 2 three times a day.   So far today she has not taken any ibuprofen or aleve.  She is on her menses now.   Patient Active Problem List   Diagnosis Date Noted  . Vitamin D deficiency 12/18/2013  . 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 Menorrhagia 12/14/2013  . Myofascial muscle pain 04/14/2012  . Iron deficiency anemia 04/14/2012  . Anxiety 11/22/2011  . Fibromyalgia 11/22/2011  . Plantar fasciitis 11/22/2011  . General medical examination 09/22/2011  . GERD (gastroesophageal reflux disease) 09/22/2011  . Insomnia 08/20/2011    Past Medical History  Diagnosis Date  . History of ovarian cyst 02/2000    DERMOID, LEFT  . Migraine   . TMJ (temporomandibular joint syndrome)   . Fibromyalgia   . Anemia   . Right ureteral stone   . Hypothyroidism 05/2012  . Renal disorder     Past Surgical History  Procedure Laterality Date  . Cystectomy      OVARIAN DERMOID  . Appendectomy      AGE 63  . Tubal ligation  2010  . Lithotripsy      History  Substance Use Topics  . Smoking status: Never Smoker   . Smokeless tobacco: Never Used  . Alcohol Use: No    Family History  Problem Relation Age of Onset  . Hypertension Father   . Alcohol abuse Father   . Breast cancer Maternal Aunt    . Cancer Paternal Grandmother     COLON , PANCREATIC  . Hyperlipidemia Mother     Allergies  Allergen Reactions  . Penicillins Hives and Itching  . Neurontin [Gabapentin]     headache    Medication list has been reviewed and updated.  Current Outpatient Prescriptions on File Prior to Visit  Medication Sig Dispense Refill  . butalbital-acetaminophen-caffeine (FIORICET, ESGIC) 50-325-40 MG per tablet TAKE 1 TO 2 TABLETS BY MOUTH EVERY 6 HOURS AS NEEDED FOR HEADACHE. 20 tablet 1  . diltiazem (CARDIZEM) 30 MG tablet Take 1 tablet (30 mg total) by mouth 4 (four) times daily as needed (elevated blood pressure >140/90). 120 tablet 0  . ergocalciferol (VITAMIN D2) 50000 UNITS capsule Take 1 capsule (50,000 Units total) by mouth once a week. 4 capsule 5  . ferrous sulfate 325 (65 FE) MG tablet Take 1 tablet by mouth 2 (two) times daily.    Marland Kitchen ibuprofen (ADVIL,MOTRIN) 200 MG tablet Take 200-800 mg by mouth every 6 (six) hours as needed for pain. Headache or pain    . pantoprazole (PROTONIX) 40 MG tablet Take 1 tablet (40 mg total) by mouth daily. 90 tablet 3  . traMADol (ULTRAM) 50 MG tablet Take  1-2 tablets (50-100 mg total) by mouth every 6 (six) hours as needed. Max 8 tabs/day. Do not combine with ambien. 240 tablet 2  . zolpidem (AMBIEN) 5 MG tablet Take 1 tablet (5 mg total) by mouth at bedtime as needed for sleep. 15 tablet 2   No current facility-administered medications on file prior to visit.    Review of Systems:  As per HPI- otherwise negative.   Physical Examination: Filed Vitals:   08/19/14 1239  BP: 142/96  Pulse: 79  Temp: 98.5 F (36.9 C)  Resp: 16   Filed Vitals:   08/19/14 1239  Height: 5' 1.5" (1.562 m)  Weight: 141 lb (63.957 kg)   Body mass index is 26.21 kg/(m^2). Ideal Body Weight: Weight in (lb) to have BMI = 25: 134.2  GEN: WDWN, NAD, Non-toxic, A & O x 3 HEENT: Atraumatic, Normocephalic. Neck supple. No masses, No LAD.  Bilateral TM wnl, oropharynx  normal.  PEERL,EOMI.   Ears and Nose: No external deformity. CV: RRR, No M/G/R. No JVD. No thrill. No extra heart sounds. PULM: CTA B, no wheezes, crackles, rhonchi. No retractions. No resp. distress. No accessory muscle use. EXTR: No c/c/e NEURO Normal gait. Complete neuro exam is wnl including strength, sensation of face and all limbs, normal DTR all limbs, negative romberg and tandem gait PSYCH: Normally interactive. Conversant. Not depressed or anxious appearing.  Calm demeanor.    Assessment and Plan: Migraine with aura and with status migrainosus, not intractable - Plan: ondansetron (ZOFRAN-ODT) disintegrating tablet 8 mg, ketorolac (TORADOL) 30 MG/ML injection 30 mg  Treat for migraine with toradol IM and zofran po.  This has helped her in the past.  She will seek further care if not better- See patient instructions for more details.     Signed Lamar Blinks, MD

## 2014-08-20 NOTE — Telephone Encounter (Signed)
Pt FMLA PPW faxed

## 2014-08-20 NOTE — Telephone Encounter (Signed)
Forms completed last night 2/8.

## 2014-08-23 ENCOUNTER — Encounter: Payer: Self-pay | Admitting: Family Medicine

## 2014-08-23 ENCOUNTER — Ambulatory Visit: Payer: 59 | Admitting: Family Medicine

## 2014-08-23 ENCOUNTER — Ambulatory Visit (INDEPENDENT_AMBULATORY_CARE_PROVIDER_SITE_OTHER): Payer: 59 | Admitting: Family Medicine

## 2014-08-23 VITALS — BP 134/86 | HR 103 | Temp 98.9°F | Resp 16 | Ht 62.0 in | Wt 139.0 lb

## 2014-08-23 DIAGNOSIS — G4700 Insomnia, unspecified: Secondary | ICD-10-CM | POA: Diagnosis not present

## 2014-08-23 DIAGNOSIS — G43911 Migraine, unspecified, intractable, with status migrainosus: Secondary | ICD-10-CM | POA: Diagnosis not present

## 2014-08-23 MED ORDER — PROMETHAZINE HCL 25 MG PO TABS: 25.0000 mg | ORAL_TABLET | ORAL | Status: DC | PRN

## 2014-08-23 MED ORDER — TRAMADOL HCL 50 MG PO TABS: 50.0000 mg | ORAL_TABLET | Freq: Four times a day (QID) | ORAL | Status: DC | PRN

## 2014-08-23 MED ORDER — BUTALBITAL-APAP-CAFFEINE 50-325-40 MG PO TABS: ORAL_TABLET | ORAL | Status: DC

## 2014-08-23 MED ORDER — METHYLPREDNISOLONE ACETATE 80 MG/ML IJ SUSP
80.0000 mg | Freq: Once | INTRAMUSCULAR | Status: AC
  Administered 2014-08-23: 80 mg via INTRAMUSCULAR

## 2014-08-23 MED ORDER — KETOROLAC TROMETHAMINE 60 MG/2ML IM SOLN
60.0000 mg | Freq: Once | INTRAMUSCULAR | Status: AC
Start: 2014-08-23 — End: 2014-08-23
  Administered 2014-08-23: 60 mg via INTRAMUSCULAR

## 2014-08-23 MED ORDER — SUMATRIPTAN SUCCINATE 100 MG PO TABS: 100.0000 mg | ORAL_TABLET | Freq: Once | ORAL | Status: AC

## 2014-08-23 MED ORDER — ZOLPIDEM TARTRATE 5 MG PO TABS: 5.0000 mg | ORAL_TABLET | Freq: Every evening | ORAL | Status: DC | PRN

## 2014-08-23 MED ORDER — CLONAZEPAM 0.5 MG PO TABS: 0.5000 mg | ORAL_TABLET | Freq: Every evening | ORAL | Status: DC | PRN

## 2014-08-23 NOTE — Patient Instructions (Signed)

## 2014-08-26 ENCOUNTER — Other Ambulatory Visit: Payer: Self-pay | Admitting: Family Medicine

## 2014-08-26 ENCOUNTER — Encounter: Payer: Self-pay | Admitting: Family Medicine

## 2014-08-27 ENCOUNTER — Encounter: Payer: Self-pay | Admitting: Family Medicine

## 2014-08-30 ENCOUNTER — Encounter: Payer: Self-pay | Admitting: Family Medicine

## 2014-08-30 ENCOUNTER — Encounter: Payer: 59 | Admitting: Family Medicine

## 2014-09-02 ENCOUNTER — Encounter: Payer: Self-pay | Admitting: Family Medicine

## 2014-09-03 ENCOUNTER — Telehealth: Payer: Self-pay | Admitting: *Deleted

## 2014-09-03 NOTE — Progress Notes (Signed)
Subjective:    Patient ID: Jenna Gray, female    DOB: 02/24/1981, 34 y.o.   MRN: [id number] This chart was scribed for [name], MD by Marti Sleigh, Medical Scribe. This patient was seen in Room 24 and the patient's care was started a 10:19 AM.  Chief Complaint  Patient presents with  . Medication Management    HPI Past Medical History  Diagnosis Date  . History of ovarian cyst 02/2000    DERMOID, LEFT  . Migraine   . TMJ (temporomandibular joint syndrome)   . Fibromyalgia   . Anemia   . Right ureteral stone   . Hypothyroidism 05/2012  . Renal disorder    Allergies  Allergen Reactions  . Penicillins Hives and Itching  . Neurontin [Gabapentin]     headache   Current Outpatient Prescriptions on File Prior to Visit  Medication Sig Dispense Refill  . diltiazem (CARDIZEM) 30 MG tablet Take 1 tablet (30 mg total) by mouth 4 (four) times daily as needed (elevated blood pressure >140/90). 120 tablet 0  . ergocalciferol (VITAMIN D2) 50000 UNITS capsule Take 1 capsule (50,000 Units total) by mouth once a week. 4 capsule 5  . ferrous sulfate 325 (65 FE) MG tablet Take 1 tablet by mouth 2 (two) times daily.    Marland Kitchen ibuprofen (ADVIL,MOTRIN) 200 MG tablet Take 200-800 mg by mouth every 6 (six) hours as needed for pain. Headache or pain    . pantoprazole (PROTONIX) 40 MG tablet Take 1 tablet (40 mg total) by mouth daily. 90 tablet 3   No current facility-administered medications on file prior to visit.     HPI Comments: Jenna Gray is a 34 y.o. female who presents to [facility] complaining of migraine HA.  Has been out of work several days as has been intractable.  Recently migraines have been well controlled until this one.    Past Medical History  Diagnosis Date  . History of ovarian cyst 02/2000    DERMOID, LEFT  . Migraine   . TMJ (temporomandibular joint syndrome)   . Fibromyalgia   . Anemia   . Right ureteral stone   . Hypothyroidism 05/2012  . Renal disorder     Current Outpatient Prescriptions on File Prior to Visit  Medication Sig Dispense Refill  . diltiazem (CARDIZEM) 30 MG tablet Take 1 tablet (30 mg total) by mouth 4 (four) times daily as needed (elevated blood pressure >140/90). 120 tablet 0  . ergocalciferol (VITAMIN D2) 50000 UNITS capsule Take 1 capsule (50,000 Units total) by mouth once a week. 4 capsule 5  . ferrous sulfate 325 (65 FE) MG tablet Take 1 tablet by mouth 2 (two) times daily.    Marland Kitchen ibuprofen (ADVIL,MOTRIN) 200 MG tablet Take 200-800 mg by mouth every 6 (six) hours as needed for pain. Headache or pain    . pantoprazole (PROTONIX) 40 MG tablet Take 1 tablet (40 mg total) by mouth daily. 90 tablet 3   No current facility-administered medications on file prior to visit.   Allergies  Allergen Reactions  . Penicillins Hives and Itching  . Neurontin [Gabapentin]     headache      Review of Systems  Constitutional: Positive for activity change and fatigue. Negative for fever, chills, diaphoresis, appetite change and unexpected weight change.  HENT: Negative for congestion, dental problem, ear pain, rhinorrhea, sinus pressure and tinnitus.   Eyes: Positive for photophobia. Negative for pain, discharge and visual disturbance.  Gastrointestinal: Positive for nausea.  Musculoskeletal: Negative for neck pain and neck stiffness.  Skin: Negative for rash.  Neurological: Positive for headaches. Negative for dizziness, tremors, seizures, syncope, facial asymmetry, speech difficulty, weakness, light-headedness and numbness.  Psychiatric/Behavioral: Positive for sleep disturbance. The patient is not nervous/anxious.        Objective:   Physical Exam  Constitutional: She is oriented to person, place, and time. She appears well-developed and well-nourished.  HENT:  Head: Normocephalic and atraumatic.  Eyes: Pupils are equal, round, and reactive to light.  Neck: Neck supple.  Cardiovascular: Normal rate and regular rhythm.    Pulmonary/Chest: Effort normal and breath sounds normal. No respiratory distress.  Neurological: She is alert and oriented to person, place, and time.  Skin: Skin is warm and dry.  Psychiatric: She has a normal mood and affect. Her behavior is normal.  Nursing note and vitals reviewed.  BP 134/86 mmHg  Pulse 103  Temp(Src) 98.9 F (37.2 C)  Resp 16  Ht 5\' 2"  (1.575 m)  Wt 139 lb (63.05 kg)  BMI 25.42 kg/m2  SpO2 98%  LMP 08/17/2014      Assessment & Plan:   Intractable migraine with status migrainosus, unspecified migraine type - Plan: ketorolac (TORADOL) injection 60 mg, methylPREDNISolone acetate (DEPO-MEDROL) injection 80 mg - using diltiazem for preventative and responding well until this migraine.  Toradol and DepoMedrol in office today to break HA - then try klonopin qhs instead of ambien.   Meds ordered this encounter  Medications  . butalbital-acetaminophen-caffeine (FIORICET, ESGIC) 50-325-40 MG per tablet    Sig: TAKE 1 TO 2 TABLETS BY MOUTH EVERY 6 HOURS AS NEEDED FOR HEADACHE.    Dispense:  20 tablet    Refill:  1  . traMADol (ULTRAM) 50 MG tablet    Sig: Take 1-2 tablets (50-100 mg total) by mouth every 6 (six) hours as needed. Max 8 tabs/day. Do not combine with ambien.    Dispense:  240 tablet    Refill:  2  . zolpidem (AMBIEN) 5 MG tablet    Sig: Take 1 tablet (5 mg total) by mouth at bedtime as needed for sleep.    Dispense:  15 tablet    Refill:  2  . SUMAtriptan (IMITREX) 100 MG tablet    Sig: Take 1 tablet (100 mg total) by mouth once. May repeat in 2 hours if headache persists or recurs.    Dispense:  10 tablet    Refill:  5  . promethazine (PHENERGAN) 25 MG tablet    Sig: Take 1 tablet (25 mg total) by mouth every 4 (four) hours as needed for nausea or vomiting (with migraine).    Dispense:  30 tablet    Refill:  0  . clonazePAM (KLONOPIN) 0.5 MG tablet    Sig: Take 1-2 tablets (0.5-1 mg total) by mouth at bedtime as needed for anxiety.     Dispense:  30 tablet    Refill:  0  . ketorolac (TORADOL) injection 60 mg    Sig:   . methylPREDNISolone acetate (DEPO-MEDROL) injection 80 mg    Sig:     I personally performed the services described in this documentation, which was scribed in my presence. The recorded information has been reviewed and considered, and addended by me as needed.  [name], MD MPH

## 2014-09-03 NOTE — Telephone Encounter (Signed)
Called and left pt an message regarding some questions about her FMLA.  Per Dr. Brigitte Pulse, she would like to know what day to start the pts FMLA and to see if she has went back to work so that an work release note may be completed and faxed.

## 2014-09-05 ENCOUNTER — Other Ambulatory Visit: Payer: Self-pay

## 2014-09-05 MED ORDER — CLONAZEPAM 0.5 MG PO TABS: 0.5000 mg | ORAL_TABLET | Freq: Every evening | ORAL | Status: DC | PRN

## 2014-09-05 NOTE — Telephone Encounter (Signed)
Yes - pt said this worked better for her sleep than the Lorrin Mais so will switch her over - please fax or call in for pt, thanks.  She needs to be seen in f/u OV in 3 months for refills which is what she normally does for her other meds so should have f/u OV already sched.

## 2014-09-05 NOTE — Telephone Encounter (Signed)
Pharm reqs RF of clonazepam. Pended. 

## 2014-09-09 ENCOUNTER — Telehealth: Payer: Self-pay

## 2014-09-09 ENCOUNTER — Telehealth: Payer: Self-pay | Admitting: Radiology

## 2014-09-09 ENCOUNTER — Encounter: Payer: Self-pay | Admitting: Radiology

## 2014-09-09 NOTE — Telephone Encounter (Signed)
[employer] [place] will not let patient rtw w/o a note.  She needs it by the end of today.  Please help so she can work.   [phone]

## 2014-09-09 NOTE — Telephone Encounter (Signed)
Pt states we still have not faxed her rtw note to [place] @ [phone]   Best phone for pt is [phone]

## 2014-09-09 NOTE — Telephone Encounter (Signed)
Pt was contacted by Lamar Blinks on 2/23 -see below - I asked Davina to fax a letter to pt's employer - [employer] - on pt's behalf on 02/23 stating at that she was released back to work at that time. Please check in letters to see if done and resend if needed. I still have FMLA papers for pt as well but need to know what exactly pt needs covered for her migraines - when she went out of work and when she went back.

## 2014-09-09 NOTE — Telephone Encounter (Signed)
Pt wants to return today. She has not gone back to work because they will not let her without the note.

## 2014-09-09 NOTE — Telephone Encounter (Signed)
Sent message to Dr. Brigitte Pulse regarding situation.

## 2014-09-09 NOTE — Telephone Encounter (Signed)
Spoke with Museum/gallery conservator and apologized for return to work letter not being written or faxed. I personally faxed the letter to her employer at [phone].

## 2014-09-09 NOTE — Telephone Encounter (Signed)
Faxed

## 2014-09-11 NOTE — Telephone Encounter (Signed)
Michelle handles this. See previous notes.

## 2014-09-12 NOTE — Telephone Encounter (Signed)
Patient called about her FMLA forms. States that the deadline has already passed and they have started the denial process. Dr. Brigitte Pulse completed forms on 09/12/2014 and they were faxed the same day. Patient notified.

## 2014-09-25 ENCOUNTER — Emergency Department (HOSPITAL_COMMUNITY)
Admission: EM | Admit: 2014-09-25 | Discharge: 2014-09-25 | Disposition: A | Discharge status: Home / Self Care | Payer: 59 | Attending: Emergency Medicine | Admitting: Emergency Medicine

## 2014-09-25 ENCOUNTER — Encounter (HOSPITAL_COMMUNITY): Payer: Self-pay | Admitting: Family Medicine

## 2014-09-25 DIAGNOSIS — Z8742 Personal history of other diseases of the female genital tract: Secondary | ICD-10-CM | POA: Diagnosis not present

## 2014-09-25 DIAGNOSIS — Z8639 Personal history of other endocrine, nutritional and metabolic disease: Secondary | ICD-10-CM | POA: Insufficient documentation

## 2014-09-25 DIAGNOSIS — M791 Myalgia, unspecified site: Secondary | ICD-10-CM

## 2014-09-25 DIAGNOSIS — Z79899 Other long term (current) drug therapy: Secondary | ICD-10-CM | POA: Insufficient documentation

## 2014-09-25 DIAGNOSIS — R531 Weakness: Secondary | ICD-10-CM | POA: Insufficient documentation

## 2014-09-25 DIAGNOSIS — Z9851 Tubal ligation status: Secondary | ICD-10-CM | POA: Insufficient documentation

## 2014-09-25 DIAGNOSIS — D649 Anemia, unspecified: Secondary | ICD-10-CM | POA: Insufficient documentation

## 2014-09-25 DIAGNOSIS — G43909 Migraine, unspecified, not intractable, without status migrainosus: Secondary | ICD-10-CM | POA: Diagnosis not present

## 2014-09-25 DIAGNOSIS — M25552 Pain in left hip: Secondary | ICD-10-CM | POA: Insufficient documentation

## 2014-09-25 DIAGNOSIS — Z880 Allergy status to penicillin: Secondary | ICD-10-CM | POA: Diagnosis not present

## 2014-09-25 DIAGNOSIS — Z87442 Personal history of urinary calculi: Secondary | ICD-10-CM | POA: Diagnosis not present

## 2014-09-25 MED ORDER — IBUPROFEN 800 MG PO TABS: 800.0000 mg | ORAL_TABLET | Freq: Three times a day (TID) | ORAL | Status: DC | PRN

## 2014-09-25 MED ORDER — CYCLOBENZAPRINE HCL 10 MG PO TABS: 10.0000 mg | ORAL_TABLET | Freq: Three times a day (TID) | ORAL | Status: DC | PRN

## 2014-09-25 MED ORDER — CYCLOBENZAPRINE HCL 10 MG PO TABS
10.0000 mg | ORAL_TABLET | Freq: Once | ORAL | Status: AC
  Administered 2014-09-25: 10 mg via ORAL
  Filled 2014-09-25: qty 1

## 2014-09-25 MED ORDER — KETOROLAC TROMETHAMINE 60 MG/2ML IM SOLN
60.0000 mg | Freq: Once | INTRAMUSCULAR | Status: AC
  Administered 2014-09-25: 60 mg via INTRAMUSCULAR
  Filled 2014-09-25: qty 2

## 2014-09-25 NOTE — Discharge Instructions (Signed)
Read the information below.  Use the prescribed medication as directed.  Please discuss all new medications with your pharmacist.  You may return to the Emergency Department at any time for worsening condition or any new symptoms that concern you.  If there is any possibility that you might be pregnant, please let your health care provider know and discuss this with the pharmacist to ensure medication safety.   If you develop uncontrolled pain, weakness or numbness of the extremity, severe discoloration of the skin, or you are unable to walk, return to the ER for a recheck.      Hip Pain Your hip is the joint between your upper legs and your lower pelvis. The bones, cartilage, tendons, and muscles of your hip joint perform a lot of work each day supporting your body weight and allowing you to move around. Hip pain can range from a minor ache to severe pain in one or both of your hips. Pain may be felt on the inside of the hip joint near the groin, or the outside near the buttocks and upper thigh. You may have swelling or stiffness as well.  HOME CARE INSTRUCTIONS   Take medicines only as directed by your health care provider.  Apply ice to the injured area:  Put ice in a plastic bag.  Place a towel between your skin and the bag.  Leave the ice on for 15-20 minutes at a time, 3-4 times a day.  Keep your leg raised (elevated) when possible to lessen swelling.  Avoid activities that cause pain.  Follow specific exercises as directed by your health care provider.  Sleep with a pillow between your legs on your most comfortable side.  Record how often you have hip pain, the location of the pain, and what it feels like. SEEK MEDICAL CARE IF:   You are unable to put weight on your leg.  Your hip is red or swollen or very tender to touch.  Your pain or swelling continues or worsens after 1 week.  You have increasing difficulty walking.  You have a fever. SEEK IMMEDIATE MEDICAL CARE IF:     You have fallen.  You have a sudden increase in pain and swelling in your hip. MAKE SURE YOU:   Understand these instructions.  Will watch your condition.  Will get help right away if you are not doing well or get worse. Document Released: 12/16/2009 Document Revised: 11/12/2013 Document Reviewed: 02/22/2013 [facility] Patient Information 2015 [place], [place]. This information is not intended to replace advice given to you by your health care provider. Make sure you discuss any questions you have with your health care provider.

## 2014-09-25 NOTE — ED Notes (Signed)
Pt sts she woke up this am with left hip and upper thigh pain. Pt ambulated into the ER with no issues.

## 2014-09-25 NOTE — ED Notes (Signed)
Pt took tramadol at 0645 ( for chronic pain)

## 2014-09-25 NOTE — ED Provider Notes (Signed)
CSN: [id number]     Arrival date & time 09/25/14  0927 History  This chart was scribed for non-physician practitioner, Clayton Bibles, PA-C, working with Orpah Greek, MD,by Hatice Demirci, ED Scribe. This patient was seen in room TR10C/TR10C and the patient's care was started at 10:29 AM.     No chief complaint on file.  (Consider location/radiation/quality/duration/timing/severity/associated sxs/prior Treatment) The history is provided by the patient. No language interpreter was used.    HPI Comments: Jenna Gray is a 34 y.o. female who presents to the Emergency Department complaining constant (7/10 now) of left hip and upper thigh pain onset this morning (pain was a 10 this morning). Patient notes that she was sleeping on her right side. Patient states that when she was trying to use the bathroom, she was in extreme pain while trying to putting weight on it.   She notes that the pain has gotten better now but she is still unable to put weight on it.She has associated symptoms of numbness.  She denies any recent injury, heavy lifting, or strenuous activity, chronic back issues.  She has been busy with her four kids however and doing a lot around the house.  Patient notes that she has been eating/ drinking regularly.  Patient has a history of fibromyalgia in her neck, back, and arms.  Patient denies any change in bowel movements, abdominal pain, nausea or vomiting. Patient has only taken her Tramadol today.  Patient had a stomach bug but with some vomiting 9 days ago.   Past Medical History  Diagnosis Date  . History of ovarian cyst 02/2000    DERMOID, LEFT  . Migraine   . TMJ (temporomandibular joint syndrome)   . Fibromyalgia   . Anemia   . Right ureteral stone   . Hypothyroidism 05/2012  . Renal disorder    Past Surgical History  Procedure Laterality Date  . Cystectomy      OVARIAN DERMOID  . Appendectomy      AGE 81  . Tubal ligation  2010  . Lithotripsy     Family  History  Problem Relation Age of Onset  . Hypertension Father   . Alcohol abuse Father   . Breast cancer Maternal Aunt   . Cancer Paternal Grandmother     COLON , PANCREATIC  . Hyperlipidemia Mother    History  Substance Use Topics  . Smoking status: Never Smoker   . Smokeless tobacco: Never Used  . Alcohol Use: No   OB History    Gravida Para Term Preterm AB TAB SAB Ectopic Multiple Living   4 4        4      Review of Systems  Constitutional: Negative for fever and chills.  HENT: Negative for drooling.   Gastrointestinal: Negative for nausea and vomiting.  Genitourinary: Negative for urgency, frequency and difficulty urinating.  Musculoskeletal: Positive for myalgias.  Neurological: Positive for weakness and numbness.  All other systems reviewed and are negative.     Allergies  Penicillins and Neurontin  Home Medications   Prior to Admission medications   Medication Sig Start Date End Date Taking? Authorizing Provider  butalbital-acetaminophen-caffeine (FIORICET, ESGIC) 50-325-40 MG per tablet TAKE 1 TO 2 TABLETS BY MOUTH EVERY 6 HOURS AS NEEDED FOR HEADACHE. 08/23/14   Shawnee Knapp, MD  clonazePAM (KLONOPIN) 0.5 MG tablet Take 1-2 tablets (0.5-1 mg total) by mouth at bedtime as needed for anxiety. 09/05/14   Shawnee Knapp, MD  diltiazem [place])  30 MG tablet Take 1 tablet (30 mg total) by mouth 4 (four) times daily as needed (elevated blood pressure >140/90). 03/22/14   Shawnee Knapp, MD  ergocalciferol (VITAMIN D2) 50000 UNITS capsule Take 1 capsule (50,000 Units total) by mouth once a week. 12/18/13   Shawnee Knapp, MD  ferrous sulfate 325 (65 FE) MG tablet Take 1 tablet by mouth 2 (two) times daily.    Historical Provider, MD  ibuprofen (ADVIL,MOTRIN) 200 MG tablet Take 200-800 mg by mouth every 6 (six) hours as needed for pain. Headache or pain    Historical Provider, MD  pantoprazole (PROTONIX) 40 MG tablet Take 1 tablet (40 mg total) by mouth daily. 06/21/14   Shawnee Knapp, MD   promethazine (PHENERGAN) 25 MG tablet Take 1 tablet (25 mg total) by mouth every 4 (four) hours as needed for nausea or vomiting (with migraine). 08/23/14   Shawnee Knapp, MD  SUMAtriptan (IMITREX) 100 MG tablet Take 1 tablet (100 mg total) by mouth once. May repeat in 2 hours if headache persists or recurs. 08/23/14   Shawnee Knapp, MD  traMADol (ULTRAM) 50 MG tablet Take 1-2 tablets (50-100 mg total) by mouth every 6 (six) hours as needed. Max 8 tabs/day. Do not combine with ambien. 08/23/14   Shawnee Knapp, MD  zolpidem (AMBIEN) 5 MG tablet Take 1 tablet (5 mg total) by mouth at bedtime as needed for sleep. 08/23/14   Shawnee Knapp, MD   There were no vitals taken for this visit. Physical Exam  Constitutional: She appears well-developed and well-nourished. No distress.  HENT:  Head: Normocephalic and atraumatic.  Neck: Neck supple.  Pulmonary/Chest: Effort normal.  Musculoskeletal:  Left hip without bony tednerness  Full passive ROM without pain Pain in hip flexors with active ROM of the left hip No tenderness with palp throughout the left leg  No erythema, edema, warmth,or tenderness distal pulses and sensation in tact and equal bilaterally.    Neurological: She is alert.  Skin: She is not diaphoretic.  Nursing note and vitals reviewed.   ED Course  Procedures (including critical care time) DIAGNOSTIC STUDIES: Oxygen Saturation is 100% on RA, normal by my interpretation.    COORDINATION OF CARE: 10:42 AM Discussed treatment plan with patient at beside, the patient agrees with the plan and has no further questions at this time.   Labs Review Labs Reviewed - No data to display  Imaging Review No results found.   EKG Interpretation None      MDM   Final diagnoses:  Acute hip pain, left  Muscle pain   Afebrile, nontoxic patient with pain in the left hip area. No injury.  No bony tenderness.  No tenderness over the bursa.  She does have muscle pain with active movement against  resistance.  Neurovascularly intact.  Suspect muscle strain.   D/C home with flexeril, ibuprofen, close PCP follow up.  Discussed result, findings, treatment, and follow up  with patient.  Pt given return precautions.  Pt verbalizes understanding and agrees with plan.       I personally performed the services described in this documentation, which was scribed in my presence. The recorded information has been reviewed and is accurate.    Clayton Bibles, PA-C 09/25/14 [place], MD 09/28/14 1355

## 2014-09-26 ENCOUNTER — Ambulatory Visit (INDEPENDENT_AMBULATORY_CARE_PROVIDER_SITE_OTHER): Payer: 59

## 2014-09-26 ENCOUNTER — Ambulatory Visit (INDEPENDENT_AMBULATORY_CARE_PROVIDER_SITE_OTHER): Payer: 59 | Admitting: Family Medicine

## 2014-09-26 VITALS — BP 134/82 | HR 84 | Temp 98.3°F | Resp 18 | Ht 62.0 in | Wt 153.0 lb

## 2014-09-26 DIAGNOSIS — M7632 Iliotibial band syndrome, left leg: Secondary | ICD-10-CM

## 2014-09-26 DIAGNOSIS — M25552 Pain in left hip: Secondary | ICD-10-CM

## 2014-09-26 LAB — POCT UA - MICROSCOPIC ONLY
Bacteria, U Microscopic: NEGATIVE
CRYSTALS, UR, HPF, POC: NEGATIVE
Casts, Ur, LPF, POC: NEGATIVE
MUCUS UA: NEGATIVE
RBC, URINE, MICROSCOPIC: NEGATIVE
Yeast, UA: NEGATIVE

## 2014-09-26 LAB — POCT CBC
GRANULOCYTE PERCENT: 61.2 % (ref 37–80)
HEMATOCRIT: 29.3 % — AB (ref 37.7–47.9)
Hemoglobin: 8.3 g/dL — AB (ref 12.2–16.2)
LYMPH, POC: 2.4 (ref 0.6–3.4)
MCH, POC: 18.9 pg — AB (ref 27–31.2)
MCHC: 28.2 g/dL — AB (ref 31.8–35.4)
MCV: 67.1 fL — AB (ref 80–97)
MID (cbc): 0.5 (ref 0–0.9)
MPV: 8.7 fL (ref 0–99.8)
POC Granulocyte: 4.6 (ref 2–6.9)
POC LYMPH PERCENT: 32.4 %L (ref 10–50)
POC MID %: 6.4 % (ref 0–12)
Platelet Count, POC: 380 10*3/uL (ref 142–424)
RBC: 4.37 M/uL (ref 4.04–5.48)
RDW, POC: 18.8 %
WBC: 7.5 10*3/uL (ref 4.6–10.2)

## 2014-09-26 LAB — POCT URINALYSIS DIPSTICK
Bilirubin, UA: NEGATIVE
GLUCOSE UA: NEGATIVE
KETONES UA: NEGATIVE
Nitrite, UA: NEGATIVE
PH UA: 7
Protein, UA: NEGATIVE
RBC UA: NEGATIVE
Spec Grav, UA: 1.025
Urobilinogen, UA: 0.2

## 2014-09-26 MED ORDER — CARISOPRODOL 350 MG PO TABS: 350.0000 mg | ORAL_TABLET | Freq: Every day | ORAL | Status: DC

## 2014-09-26 MED ORDER — METHOCARBAMOL 500 MG PO TABS: 500.0000 mg | ORAL_TABLET | Freq: Four times a day (QID) | ORAL | Status: DC

## 2014-09-26 MED ORDER — NABUMETONE 750 MG PO TABS: 750.0000 mg | ORAL_TABLET | Freq: Two times a day (BID) | ORAL | Status: DC | PRN

## 2014-09-26 NOTE — Patient Instructions (Signed)
Iliotibial Band Syndrome with Rehab The iliotibial (IT) band is a tendon that connects the hip muscles to the shinbone (tibia) and to one of the bones of the pelvis (ileum). The IT band passes by the knee and is often irritated by the outer portion of the knee (lateral femoral condyle). A fluid filled sac (bursa) exists between the tendon and the bone, to cushion and reduce friction. Overuse of the tendon may cause excessive friction, which results in IT band syndrome. This condition involves inflammation of the bursa (bursitis) and/or inflammation of the IT band (tendinitis). SYMPTOMS   Pain, tenderness, swelling, warmth, or redness over the IT band, at the outer knee (above the joint).  Pain that travels up or down the thigh or leg.  Initially, pain at the beginning of an exercise, that decreases once warmed up. Eventually, pain throughout the activity, getting worse as the activity continues. May cause the athlete to stop in the middle of training or competing.  Pain that gets worse when running down hills or stairs, on banked tracks, or next to the curb on the street.  Pain that increases when the foot of the affected leg hits the ground.  Possibly, a crackling sound (crepitation) when the tendon or bursa is moved or touched. CAUSES  IT band syndrome is caused by irritation of the IT band and the underlying bursa. This eventually results in inflammation and pain. IT band syndrome is an overuse injury.  RISK INCREASES WITH:  Sports with repetitive knee-bending activities (distance running, cycling).  Incorrect training techniques, including sudden changes in the intensity, frequency, or duration of training.  Not enough rest between workouts.  Poor strength and flexibility, especially a tight IT band.  Failure to warm up properly before activity.  Bow legs.  Arthritis of the knee. PREVENTION   Warm up and stretch properly before activity.  Allow for adequate recovery between  workouts.  Maintain physical fitness:  Strength, flexibility, and endurance.  Cardiovascular fitness.  Learn and use proper training technique, including reducing running mileage, shortening stride, and avoiding running on hills and banked surfaces.  Wear arch supports (orthotics), if you have flat feet. PROGNOSIS  If treated properly, IT band syndrome usually goes away within 6 weeks of treatment. RELATED COMPLICATIONS   Longer healing time, if not properly treated, or if not given enough time to heal.  Recurring inflammation of the tendon and bursa, that may result in a chronic condition.  Recurring symptoms, if activity is resumed too soon, with overuse, with a direct blow, or with poor training technique.  Inability to complete training or competition. TREATMENT  Treatment first involves the use of ice and medicine, to reduce pain and inflammation. The use of strengthening and stretching exercises may help reduce pain with activity. These exercises may be performed at home or with a therapist. For individuals with flat feet, an arch support (orthotic) may be helpful. Some individuals find that wearing a knee sleeve or compression bandage around the knee during workouts provides some relief. Certain training techniques, such as adjusting stride length, avoiding running on hills or stairs, changing the direction you run on a circular or banked track, or changing the side of the road you run on, if you run next to the curb, may help decrease symptoms of IT band syndrome. Cyclists may need to change the seat height or foot position on their bicycles. An injection of cortisone into the bursa may be recommended. Surgery to remove the inflamed bursa and/or part  of the IT band is only considered after at least 6 months of non-surgical treatment.  MEDICATION   If pain medicine is needed, nonsteroidal anti-inflammatory medicines (aspirin and ibuprofen), or other minor pain relievers  (acetaminophen), are often advised.  Do not take pain medicine for 7 days before surgery.  Prescription pain relievers may be given, if your caregiver thinks they are needed. Use only as directed and only as much as you need.  Corticosteroid injections may be given by your caregiver. These injections should be reserved for the most serious cases, because they may only be given a certain number of times. HEAT AND COLD  Cold treatment (icing) should be applied for 10 to 15 minutes every 2 to 3 hours for inflammation and pain, and immediately after activity that aggravates your symptoms. Use ice packs or an ice massage.  Heat treatment may be used before performing stretching and strengthening activities prescribed by your caregiver, physical therapist, or athletic trainer. Use a heat pack or a warm water soak. SEEK MEDICAL CARE IF:   Symptoms get worse or do not improve in 2 to 4 weeks, despite treatment.  New, unexplained symptoms develop. (Drugs used in treatment may produce side effects.) EXERCISES  RANGE OF MOTION (ROM) AND STRETCHING EXERCISES - Iliotibial Band Syndrome These exercises may help you when beginning to rehabilitate your injury. Your symptoms may go away with or without further involvement from your physician, physical therapist or athletic trainer. While completing these exercises, remember:   Restoring tissue flexibility helps normal motion to return to the joints. This allows healthier, less painful movement and activity.  An effective stretch should be held for at least 30 seconds.  A stretch should never be painful. You should only feel a gentle lengthening or release in the stretched tissue. STRETCH - Quadriceps, Prone   Lie on your stomach on a firm surface, such as a bed or padded floor.  Bend your right / left knee and grasp your ankle. If you are unable to reach your ankle or pant leg, use a belt around your foot to lengthen your reach.  Gently pull your heel  toward your buttocks. Your knee should not slide out to the side. You should feel a stretch in the front of your thigh and knee.  Hold this position for __________ seconds. Repeat __________ times. Complete this stretch __________ times per day.  STRETCH - Iliotibial Band  On the floor or bed, lie on your side, so your right / left leg is on top. Bend your knee and grab your ankle.  Slowly bring your knee back so that your thigh is in line with your trunk. Keep your heel at your buttocks and gently arch your back, so your head, shoulders and hips line up.  Slowly lower your leg so that your knee approaches the floor or bed, until you feel a gentle stretch on the outside of your right / left thigh. If you do not feel a stretch and your knee will not fall farther, place the heel of your opposite foot on top of your knee, and pull your thigh down farther.  Hold this stretch for __________ seconds. Repeat __________ times. Complete this stretch __________ times per day. STRENGTHENING EXERCISES - Iliotibial Band Syndrome Improving the flexibility of the IT band will best relieve your discomfort due to IT band syndrome. Strengthening exercises, however, can help improve both muscle endurance and joint mechanics, reducing the factors that can contribute to this condition. Your physician, physical   therapist or athletic trainer may provide you with exercises that train specific muscle groups that are especially weak. The following exercises target muscles that are often weak in people who have IT band syndrome. STRENGTH - Hip Abductors, Straight Leg Raises  Be aware of your form throughout the entire exercise, so that you exercise the correct muscles. Poor form means that you are not strengthening the correct muscles.  Lie on your side, so that your head, shoulders, knee and hip line up. You may bend your lower knee to help maintain your balance. Your right / left leg should be on top.  Roll your hips  slightly forward, so that your hips are stacked directly over each other and your right / left knee is facing forward.  Lift your top leg up 4-6 inches, leading with your heel. Be sure that your foot does not drift forward and that your knee does not roll toward the ceiling.  Hold this position for __________ seconds. You should feel the muscles in your outer hip lifting (you may not notice this until your leg begins to tire).  Slowly lower your leg to the starting position. Allow the muscles to fully relax before beginning the next repetition. Repeat __________ times. Complete this exercise __________ times per day.  STRENGTH - Quad/VMO, Isometric  Sit in a chair with your right / left knee slightly bent. With your fingertips, feel the VMO muscle (just above the inside of your knee). The VMO is important in controlling the position of your kneecap.  Keeping your fingertips on this muscle. Without actually moving your leg, attempt to drive your knee down, as if straightening your leg. You should feel your VMO tense. If you have a difficult time, you may wish to try the same exercise on your healthy knee first.  Tense this muscle as hard as you can, without increasing any knee pain.  Hold for __________ seconds. Relax the muscles slowly and completely between each repetition. Repeat __________ times. Complete this exercise __________ times per day.  Document Released: 06/28/2005 Document Revised: 09/20/2011 Document Reviewed: 10/10/2008 [facility] Patient Information 2015 [place], [place]. This information is not intended to replace advice given to you by your health care provider. Make sure you discuss any questions you have with your health care provider.

## 2014-09-26 NOTE — Progress Notes (Deleted)
Subjective:  This chart was scribed for Jenna Cheadle, MD by [name], Medial Scribe. This patient was seen in room 2 and the patient's care was started at 12:19 PM.    Patient ID: Jenna Gray, female    DOB: 1981-02-03, 34 y.o.   MRN: [id number] Chief Complaint  Patient presents with   Leg Pain    left leg since yesterday was seen in ED     HPI HPI Comments: Jenna Gray is a 34 y.o. female with PMHx of fibromyalgia and myofascial muscle pain who presents to the [facility] complaining of left hip and thigh pain, onset two days ago. Patient reports pain upon awakening to use the bathroom at 6:30am two days ago. Patient reports that she slept on her right side and rolled over to get out of bed to use the bathroom and experienced excruciating pain at her left hip and thigh when attempting to move the limb. Patient reports evaluation in [facility] ED yesterday. Patient reports Toradol injection at the ED, but denies any relief. Patient discharged with Flexeril and ibuprofen, she denies relief with use. Patient reports treating her pain with ice throughout the night, last night.  Patient reports exacerbating the pain with missing a step when descending her stairs at home yesterday. Patient reports feeling as if she pulled a muscle. Patient reports pain in her left hip that radiates into her groin. Patient states reports difficulty with bearing weight on her left foot, but denies numbness or weakness.   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   Menorrhagia 12/14/2013   Myofascial muscle pain 04/14/2012   Iron deficiency anemia 04/14/2012   Anxiety 11/22/2011   Fibromyalgia 11/22/2011   Plantar fasciitis 11/22/2011   General medical examination 09/22/2011   GERD (gastroesophageal reflux disease) 09/22/2011   Insomnia 08/20/2011   Past Medical History  Diagnosis Date    History of ovarian cyst 02/2000    DERMOID, LEFT   Migraine    TMJ (temporomandibular joint syndrome)    Fibromyalgia    Anemia    Right ureteral stone    Hypothyroidism 05/2012   Renal disorder    Past Surgical History  Procedure Laterality Date   Cystectomy      OVARIAN DERMOID   Appendectomy      AGE 58   Tubal ligation  2010   Lithotripsy     Allergies  Allergen Reactions   Penicillins Hives and Itching   Neurontin [Gabapentin]     headache   Prior to Admission medications   Medication Sig Start Date End Date Taking? Authorizing Provider  butalbital-acetaminophen-caffeine (FIORICET, ESGIC) 50-325-40 MG per tablet TAKE 1 TO 2 TABLETS BY MOUTH EVERY 6 HOURS AS NEEDED FOR HEADACHE. 08/23/14   Shawnee Knapp, MD  clonazePAM (KLONOPIN) 0.5 MG tablet Take 1-2 tablets (0.5-1 mg total) by mouth at bedtime as needed for anxiety. 09/05/14   Shawnee Knapp, MD  cyclobenzaprine (FLEXERIL) 10 MG tablet Take 1 tablet (10 mg total) by mouth 3 (three) times daily as needed for muscle spasms (or pain). 09/25/14   Clayton Bibles, PA-C  diltiazem (CARDIZEM) 30 MG tablet Take 1 tablet (30 mg total) by mouth 4 (four) times daily as needed (elevated blood pressure >140/90). 03/22/14   Shawnee Knapp, MD  ergocalciferol (VITAMIN D2) 50000 UNITS capsule Take 1 capsule (50,000 Units total) by mouth once a  week. 12/18/13   Shawnee Knapp, MD  ferrous sulfate 325 (65 FE) MG tablet Take 1 tablet by mouth 2 (two) times daily.    Historical Provider, MD  ibuprofen (ADVIL,MOTRIN) 800 MG tablet Take 1 tablet (800 mg total) by mouth every 8 (eight) hours as needed for mild pain or moderate pain. 09/25/14   Clayton Bibles, PA-C  pantoprazole (PROTONIX) 40 MG tablet Take 1 tablet (40 mg total) by mouth daily. 06/21/14   Shawnee Knapp, MD  promethazine (PHENERGAN) 25 MG tablet Take 1 tablet (25 mg total) by mouth every 4 (four) hours as needed for nausea or vomiting (with migraine). 08/23/14   Shawnee Knapp, MD  SUMAtriptan (IMITREX) 100  MG tablet Take 1 tablet (100 mg total) by mouth once. May repeat in 2 hours if headache persists or recurs. 08/23/14   Shawnee Knapp, MD  traMADol (ULTRAM) 50 MG tablet Take 1-2 tablets (50-100 mg total) by mouth every 6 (six) hours as needed. Max 8 tabs/day. Do not combine with ambien. 08/23/14   Shawnee Knapp, MD  zolpidem (AMBIEN) 5 MG tablet Take 1 tablet (5 mg total) by mouth at bedtime as needed for sleep. 08/23/14   Shawnee Knapp, MD   History   Social History   Marital Status: Married    Spouse Name: N/A   Number of Children: N/A   Years of Education: N/A   Occupational History   Not on file.   Social History Main Topics   Smoking status: Never Smoker    Smokeless tobacco: Never Used   Alcohol Use: No   Drug Use: No   Sexual Activity:    Partners: Male    Patent examiner Protection: Surgical   Other Topics Concern   Not on file   Social History Narrative      Review of Systems  Constitutional: Negative for fever and chills.  Gastrointestinal: Negative for diarrhea and constipation.  Genitourinary: Negative for dysuria and vaginal discharge.  Musculoskeletal: Positive for myalgias and arthralgias.  Neurological: Negative for weakness and numbness.       Objective:   Physical Exam  Constitutional: She is oriented to person, place, and time. She appears well-developed and well-nourished. No distress.  HENT:  Head: Normocephalic and atraumatic.  Eyes: EOM are normal.  Neck: Neck supple. No tracheal deviation present.  Cardiovascular: Normal rate and regular rhythm.   Pulmonary/Chest: Effort normal. No respiratory distress.  Abdominal: Soft. Bowel sounds are normal. She exhibits no distension. There is no hepatosplenomegaly. There is no tenderness. There is no rebound.  No pelvic tendrness to palpation.  Musculoskeletal: Normal range of motion.  Mild tenderness over left greater trochanter. No severe point tenderness. Normal hip flexion and extension, some  tightness of the hamstrings, but equal bilaterally.  Pain with internal rotation but normal degrees of internal and external rotation.  Mild tenderness of left SI joint.  Increased pain with left hip adduction, none with abduction.  Neurological: She is alert and oriented to person, place, and time.  Skin: Skin is warm and dry.  Psychiatric: She has a normal mood and affect. Her behavior is normal.  Nursing note and vitals reviewed.    Filed Vitals:   09/26/14 1115  BP: 134/82  Pulse: 84  Temp: 98.3 F (36.8 C)  TempSrc: Oral  Resp: 18  Height: 5\' 2"  (1.575 m)  Weight: 153 lb (69.4 kg)  SpO2: 99%   [facility] reading (PRIMARY) by  Dr. Brigitte Pulse. Pelvis/Left hip:  no acute abnormality  Risks/benefits of aspiration and injection reviewed and informed consent obtained.  Knee positioned at 10 deg flexion, cleaned with betadine x 2.  Anesthesia w/ ethyl chloride cold spray. Aspirated 10cc of clear yellow joint fluid in superior lateral approach to suprapatellar bursa and injected with 40mg  of DepoMedrol and 5cc of 1% lidocaine using 21g 1 1/2in needle without complications. Pt tolerated procedure well. No EBL.      Assessment & Plan:   Hip pain, acute, left - Plan: POCT UA - Microscopic Only, POCT urinalysis dipstick, POCT SEDIMENTATION RATE, POCT CBC, DG HIP UNILAT WITH PELVIS 2-3 VIEWS LEFT  Pain in joint, pelvic region and thigh, left - Plan: POCT UA - Microscopic Only, POCT urinalysis dipstick, POCT SEDIMENTATION RATE, POCT CBC, DG HIP UNILAT WITH PELVIS 2-3 VIEWS LEFT  Iliotibial band syndrome, left  Meds ordered this encounter  Medications   nabumetone (RELAFEN) 750 MG tablet    Sig: Take 1 tablet (750 mg total) by mouth 2 (two) times daily as needed for mild pain.    Dispense:  60 tablet    Refill:  1   methocarbamol (ROBAXIN) 500 MG tablet    Sig: Take 1 tablet (500 mg total) by mouth 4 (four) times daily.    Dispense:  60 tablet    Refill:  2   carisoprodol (SOMA) 350 MG  tablet    Sig: Take 1 tablet (350 mg total) by mouth at bedtime.    Dispense:  30 tablet    Refill:  0    I personally performed the services described in this documentation, which was scribed in my presence. The recorded information has been reviewed and considered, and addended by me as needed.  Jenna Cheadle, MD MPH

## 2014-09-27 ENCOUNTER — Telehealth: Payer: Self-pay | Admitting: Family Medicine

## 2014-09-27 NOTE — Telephone Encounter (Signed)
Patient states that her FMLA has been denied due to a question being unclear and needing more information. Lauren or I can call [employer] to see which question is causing the patient's FMLA to be denied and we will correct it and fax it back. Patient also states that she had an OV with Dr. Brigitte Pulse yesterday and they talked about patient returning to work today. However patient states that she is not well enough to go back until Monday. Can another note be written to excuse her until 09/30/2014? Please advise.  Patient CB [phone]  **For [facility] phone # is [phone] ext. [phone]

## 2014-09-28 NOTE — Telephone Encounter (Signed)
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  Poor girl has been trying to get back to work but between their demand for FMLA forms prior to this is just ridiculous

## 2014-10-01 ENCOUNTER — Other Ambulatory Visit: Payer: Self-pay | Admitting: Radiology

## 2014-10-01 NOTE — Telephone Encounter (Signed)
Will write her note.

## 2014-10-03 ENCOUNTER — Encounter: Payer: Self-pay | Admitting: Family Medicine

## 2014-10-04 MED ORDER — FLUCONAZOLE 150 MG PO TABS: 150.0000 mg | ORAL_TABLET | Freq: Once | ORAL | Status: DC

## 2014-10-14 ENCOUNTER — Ambulatory Visit: Payer: 59 | Admitting: Family Medicine

## 2014-10-15 NOTE — Progress Notes (Signed)
Subjective:  This chart was scribed for [name], MD by [name], Medial Scribe. This patient was seen in room 2 and the patient's care was started at 12:19 PM.    Patient ID: Jenna Gray, female    DOB: 10-25-1980, 34 y.o.   MRN: [id number] Chief Complaint  Patient presents with  . Leg Pain    left leg since yesterday was seen in ED     Leg Pain  Pertinent negatives include no numbness.   HPI Comments: Jenna Gray is a 34 y.o. female with PMHx of fibromyalgia and myofascial muscle pain who presents to the [facility] complaining of left hip and thigh pain, onset two days ago. Patient reports pain upon awakening to use the bathroom at 6:30am two days ago. Patient reports that she slept on her right side and rolled over to get out of bed to use the bathroom and experienced excruciating pain at her left hip and thigh when attempting to move the limb. Patient reports evaluation in [facility] ED yesterday. Patient reports Toradol injection at the ED, but denies any relief. Patient discharged with Flexeril and ibuprofen, she denies relief with use. Patient reports treating her pain with ice throughout the night, last night.  Patient reports exacerbating the pain with missing a step when descending her stairs at home yesterday. Patient reports feeling as if she pulled a muscle. Patient reports pain in her left hip that radiates into her groin. Patient states reports difficulty with bearing weight on her left foot, but denies numbness or weakness.   Patient Active Problem List   Diagnosis Date Noted  . Vitamin D deficiency 12/18/2013  . 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 Menorrhagia 12/14/2013  . Myofascial muscle pain 04/14/2012  . Iron deficiency anemia 04/14/2012  . Anxiety 11/22/2011  . Fibromyalgia 11/22/2011  . Plantar fasciitis 11/22/2011  . General medical examination 09/22/2011  . GERD (gastroesophageal reflux disease) 09/22/2011  . Insomnia  08/20/2011   Past Medical History  Diagnosis Date  . History of ovarian cyst 02/2000    DERMOID, LEFT  . Migraine   . TMJ (temporomandibular joint syndrome)   . Fibromyalgia   . Anemia   . Right ureteral stone   . Hypothyroidism 05/2012  . Renal disorder    Past Surgical History  Procedure Laterality Date  . Cystectomy      OVARIAN DERMOID  . Appendectomy      AGE 35  . Tubal ligation  2010  . Lithotripsy     Allergies  Allergen Reactions  . Penicillins Hives and Itching  . Neurontin [Gabapentin]     headache   Prior to Admission medications   Medication Sig Start Date End Date Taking? Authorizing Provider  butalbital-acetaminophen-caffeine (FIORICET, ESGIC) 50-325-40 MG per tablet TAKE 1 TO 2 TABLETS BY MOUTH EVERY 6 HOURS AS NEEDED FOR HEADACHE. 08/23/14   Shawnee Knapp, MD  clonazePAM (KLONOPIN) 0.5 MG tablet Take 1-2 tablets (0.5-1 mg total) by mouth at bedtime as needed for anxiety. 09/05/14   Shawnee Knapp, MD  cyclobenzaprine (FLEXERIL) 10 MG tablet Take 1 tablet (10 mg total) by mouth 3 (three) times daily as needed for muscle spasms (or pain). 09/25/14   Clayton Bibles, PA-C  diltiazem (CARDIZEM) 30 MG tablet Take 1 tablet (30 mg total) by mouth 4 (four) times daily as needed (elevated blood pressure >140/90). 03/22/14   Shawnee Knapp, MD  ergocalciferol (VITAMIN D2) 50000 UNITS capsule Take  1 capsule (50,000 Units total) by mouth once a week. 12/18/13   Shawnee Knapp, MD  ferrous sulfate 325 (65 FE) MG tablet Take 1 tablet by mouth 2 (two) times daily.    Historical Provider, MD  ibuprofen (ADVIL,MOTRIN) 800 MG tablet Take 1 tablet (800 mg total) by mouth every 8 (eight) hours as needed for mild pain or moderate pain. 09/25/14   Clayton Bibles, PA-C  pantoprazole (PROTONIX) 40 MG tablet Take 1 tablet (40 mg total) by mouth daily. 06/21/14   Shawnee Knapp, MD  promethazine (PHENERGAN) 25 MG tablet Take 1 tablet (25 mg total) by mouth every 4 (four) hours as needed for nausea or vomiting (with  migraine). 08/23/14   Shawnee Knapp, MD  SUMAtriptan (IMITREX) 100 MG tablet Take 1 tablet (100 mg total) by mouth once. May repeat in 2 hours if headache persists or recurs. 08/23/14   Shawnee Knapp, MD  traMADol (ULTRAM) 50 MG tablet Take 1-2 tablets (50-100 mg total) by mouth every 6 (six) hours as needed. Max 8 tabs/day. Do not combine with ambien. 08/23/14   Shawnee Knapp, MD  zolpidem (AMBIEN) 5 MG tablet Take 1 tablet (5 mg total) by mouth at bedtime as needed for sleep. 08/23/14   Shawnee Knapp, MD   History   Social History  . Marital Status: Married    Spouse Name: N/A  . Number of Children: N/A  . Years of Education: N/A   Occupational History  . Not on file.   Social History Main Topics  . Smoking status: Never Smoker   . Smokeless tobacco: Never Used  . Alcohol Use: No  . Drug Use: No  . Sexual Activity:    Partners: Male    Birth Control/ Protection: Surgical   Other Topics Concern  . Not on file   Social History Narrative      Review of Systems  Constitutional: Negative for fever and chills.  Gastrointestinal: Negative for diarrhea and constipation.  Genitourinary: Negative for dysuria and vaginal discharge.  Musculoskeletal: Positive for myalgias and arthralgias.  Neurological: Negative for weakness and numbness.       Objective:   Physical Exam  Constitutional: She is oriented to person, place, and time. She appears well-developed and well-nourished. No distress.  HENT:  Head: Normocephalic and atraumatic.  Eyes: EOM are normal.  Neck: Neck supple. No tracheal deviation present.  Cardiovascular: Normal rate and regular rhythm.   Pulmonary/Chest: Effort normal. No respiratory distress.  Abdominal: Soft. Bowel sounds are normal. She exhibits no distension. There is no hepatosplenomegaly. There is no tenderness. There is no rebound.  No pelvic tendrness to palpation.  Musculoskeletal: Normal range of motion.  Mild tenderness over left greater trochanter. No severe  point tenderness. Normal hip flexion and extension, some tightness of the hamstrings, but equal bilaterally.  Pain with internal rotation but normal degrees of internal and external rotation.  Mild tenderness of left SI joint.  Increased pain with left hip adduction, none with abduction.  Neurological: She is alert and oriented to person, place, and time.  Skin: Skin is warm and dry.  Psychiatric: She has a normal mood and affect. Her behavior is normal.  Nursing note and vitals reviewed.    Filed Vitals:   09/26/14 1115  BP: 134/82  Pulse: 84  Temp: 98.3 F (36.8 C)  TempSrc: Oral  Resp: 18  Height: 5\' 2"  (1.575 m)  Weight: 153 lb (69.4 kg)  SpO2: 99%  [facility] reading (PRIMARY) by  Dr. Brigitte Pulse. Pelvis/Left hip: no acute abnormality  Risks/benefits of aspiration and injection reviewed and informed consent obtained.  Knee positioned at 10 deg flexion, cleaned with betadine x 2.  Anesthesia w/ ethyl chloride cold spray. Aspirated 10cc of clear yellow joint fluid in superior lateral approach to suprapatellar bursa and injected with 40mg  of DepoMedrol and 5cc of 1% lidocaine using 21g 1 1/2in needle without complications. Pt tolerated procedure well. No EBL.      Assessment & Plan:   Hip pain, acute, left - Plan: POCT UA - Microscopic Only, POCT urinalysis dipstick, POCT SEDIMENTATION RATE, POCT CBC, DG HIP UNILAT WITH PELVIS 2-3 VIEWS LEFT  Pain in joint, pelvic region and thigh, left - Plan: POCT UA - Microscopic Only, POCT urinalysis dipstick, POCT SEDIMENTATION RATE, POCT CBC, DG HIP UNILAT WITH PELVIS 2-3 VIEWS LEFT  Iliotibial band syndrome, left  Meds ordered this encounter  Medications  . nabumetone (RELAFEN) 750 MG tablet    Sig: Take 1 tablet (750 mg total) by mouth 2 (two) times daily as needed for mild pain.    Dispense:  60 tablet    Refill:  1  . methocarbamol (ROBAXIN) 500 MG tablet    Sig: Take 1 tablet (500 mg total) by mouth 4 (four) times daily.    Dispense:  60  tablet    Refill:  2  . carisoprodol (SOMA) 350 MG tablet    Sig: Take 1 tablet (350 mg total) by mouth at bedtime.    Dispense:  30 tablet    Refill:  0    I personally performed the services described in this documentation, which was scribed in my presence. The recorded information has been reviewed and considered, and addended by me as needed.  [name], MD MPH

## 2014-10-17 ENCOUNTER — Encounter: Payer: Self-pay | Admitting: Family Medicine

## 2014-10-22 ENCOUNTER — Telehealth: Payer: Self-pay

## 2014-10-22 NOTE — Telephone Encounter (Signed)
Patient had CE last May and [place] in June. She is now ready to schedule Her Option Ablation.  I checked her ins benefits and it is covered 100%.  Please advise would you like me to schedule her with start of next period and have her start Prometrium 200mg  beginning Day 3 of cycle?

## 2014-10-23 ENCOUNTER — Other Ambulatory Visit: Payer: Self-pay | Admitting: Gynecology

## 2014-10-23 DIAGNOSIS — N920 Excessive and frequent menstruation with regular cycle: Secondary | ICD-10-CM

## 2014-10-23 MED ORDER — PROGESTERONE MICRONIZED 200 MG PO CAPS: ORAL_CAPSULE | ORAL | Status: DC

## 2014-10-23 NOTE — Telephone Encounter (Signed)
I spoke with patient and scheduled her for Friday May 20 at 8:30am.  She was instructed regarding needing to have a full bladder and written instructions regarding this were mailed to her.  She was also instructed that she will need to begin Prometrium on Day 3 of menses and take daily until procedure.  She anticipates her menses at end of April or beginning of May.  Rx was sent.  I scheduled her for the day prior at 4pm for pre-op/laminary and patient is aware of the laminaria to be inserted.

## 2014-10-23 NOTE — Telephone Encounter (Signed)
Yes go ahead and prescribe her the Prometrium to start on the third day of her cycle

## 2014-10-24 ENCOUNTER — Encounter: Payer: Self-pay | Admitting: Gynecology

## 2014-10-24 ENCOUNTER — Other Ambulatory Visit: Payer: Self-pay | Admitting: Gynecology

## 2014-10-24 ENCOUNTER — Telehealth: Payer: Self-pay

## 2014-10-24 MED ORDER — PROGESTERONE MICRONIZED 200 MG PO CAPS: ORAL_CAPSULE | ORAL | Status: AC

## 2014-10-24 NOTE — Telephone Encounter (Signed)
I called patient to discuss that I had discovered after scheduling her that u/s tech is actually not in the office the day we scheduled her.but the schedule did not reflect that.  I explained I needed to reschedule. We r/s her to 11/22/14. She anticipates her period sometime end of April to beginning of May.  We r/s her pre-op appt to the day prior.  All times remained the same the date just changed. I mailed her a new full bladder instruction sheet with the new date on it.  I called her pharmacy and reduced the number of Prometrium to fit accordingly.

## 2014-10-28 ENCOUNTER — Other Ambulatory Visit: Payer: Self-pay

## 2014-10-28 MED ORDER — CLONAZEPAM 0.5 MG PO TABS: 0.5000 mg | ORAL_TABLET | Freq: Every evening | ORAL | Status: DC | PRN

## 2014-10-28 NOTE — Telephone Encounter (Signed)
Pharm reqs Rf of clonazepam.

## 2014-10-29 NOTE — Telephone Encounter (Signed)
Faxed

## 2014-11-06 ENCOUNTER — Ambulatory Visit: Payer: 59 | Admitting: Family Medicine

## 2014-11-18 ENCOUNTER — Ambulatory Visit (INDEPENDENT_AMBULATORY_CARE_PROVIDER_SITE_OTHER): Payer: 59 | Admitting: Family Medicine

## 2014-11-18 VITALS — BP 142/96 | HR 84 | Temp 98.6°F | Resp 16 | Ht 61.5 in | Wt 142.6 lb

## 2014-11-18 DIAGNOSIS — N921 Excessive and frequent menstruation with irregular cycle: Secondary | ICD-10-CM | POA: Diagnosis not present

## 2014-11-18 DIAGNOSIS — K219 Gastro-esophageal reflux disease without esophagitis: Secondary | ICD-10-CM | POA: Diagnosis not present

## 2014-11-18 DIAGNOSIS — F419 Anxiety disorder, unspecified: Secondary | ICD-10-CM | POA: Diagnosis not present

## 2014-11-18 DIAGNOSIS — G4700 Insomnia, unspecified: Secondary | ICD-10-CM | POA: Diagnosis not present

## 2014-11-18 DIAGNOSIS — M797 Fibromyalgia: Secondary | ICD-10-CM | POA: Diagnosis not present

## 2014-11-18 DIAGNOSIS — D509 Iron deficiency anemia, unspecified: Secondary | ICD-10-CM | POA: Diagnosis not present

## 2014-11-18 MED ORDER — NABUMETONE 750 MG PO TABS: 750.0000 mg | ORAL_TABLET | Freq: Two times a day (BID) | ORAL | Status: AC | PRN

## 2014-11-18 MED ORDER — TRAMADOL HCL 50 MG PO TABS: 50.0000 mg | ORAL_TABLET | Freq: Four times a day (QID) | ORAL | Status: DC | PRN

## 2014-11-18 MED ORDER — BUTALBITAL-APAP-CAFFEINE 50-325-40 MG PO TABS: ORAL_TABLET | ORAL | Status: AC

## 2014-11-18 MED ORDER — CLONAZEPAM 2 MG PO TABS: 1.0000 mg | ORAL_TABLET | Freq: Every evening | ORAL | Status: AC | PRN

## 2014-11-18 MED ORDER — PANTOPRAZOLE SODIUM 40 MG PO TBEC: 40.0000 mg | DELAYED_RELEASE_TABLET | Freq: Every day | ORAL | Status: AC

## 2014-11-18 MED ORDER — METHOCARBAMOL 500 MG PO TABS: 500.0000 mg | ORAL_TABLET | Freq: Four times a day (QID) | ORAL | Status: AC

## 2014-11-18 NOTE — Progress Notes (Signed)
Subjective:  This chart was scribed for Jenna Cheadle, MD, by Starleen Arms, Medical Scribe. This patient was seen in room 1 and the patient's care was started at 6:17 PM.   Patient ID: Jenna Gray, female    DOB: [birth year], 34 y.o.   MRN: [id number] Chief Complaint  Patient presents with  . Follow-up    medications    HPI HPI Comments: Jenna Gray is a 34 y.o. female who presents to the [facility] for follow up.    Patient reports she was scheduled for an endometrial ablation on 5/13 due to severe menstrual cycles.  However, the patient recently lost her job because her employer, [employer], did not receive her FMLA paperwork that covered several health related absences.  As a result, the patient will soon lose her health insurance.  Patient made every attempt to provide the required information but the information never reached the appropriate individuals.  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    She also requests medication refills before her health insurance policy lapses.  The medications requested include Imitrex, Protonix, nabumetone, Robaxin, Klonopin, Fioricet, and Tramadol.    Patient has four children: 51, 48, 42, and 75 years old.  Her oldest son is currently working for her [employer] and in Hotel manager school, uncertain of what career to choose.   Past Medical History  Diagnosis Date  . History of ovarian cyst 02/2000    DERMOID, LEFT  . Migraine   . TMJ (temporomandibular joint syndrome)   . Fibromyalgia   . Anemia   . Right ureteral stone   . Hypothyroidism 05/2012  . Renal disorder    Current Outpatient Prescriptions on File Prior to Visit  Medication Sig Dispense Refill  . butalbital-acetaminophen-caffeine (FIORICET, ESGIC) 50-325-40 MG per tablet TAKE 1 TO 2 TABLETS BY MOUTH EVERY 6 HOURS AS NEEDED FOR HEADACHE. 20 tablet 1  . clonazePAM (KLONOPIN) 0.5 MG tablet Take 1-2 tablets (0.5-1 mg total) by mouth at  bedtime as needed for anxiety. 60 tablet 0  . ergocalciferol (VITAMIN D2) 50000 UNITS capsule Take 1 capsule (50,000 Units total) by mouth once a week. 4 capsule 5  . ferrous sulfate 325 (65 FE) MG tablet Take 1 tablet by mouth 2 (two) times daily.    Marland Kitchen ibuprofen (ADVIL,MOTRIN) 800 MG tablet Take 1 tablet (800 mg total) by mouth every 8 (eight) hours as needed for mild pain or moderate pain. 15 tablet 0  . methocarbamol (ROBAXIN) 500 MG tablet Take 1 tablet (500 mg total) by mouth 4 (four) times daily. 60 tablet 2  . nabumetone (RELAFEN) 750 MG tablet Take 1 tablet (750 mg total) by mouth 2 (two) times daily as needed for mild pain. 60 tablet 1  . pantoprazole (PROTONIX) 40 MG tablet Take 1 tablet (40 mg total) by mouth daily. 90 tablet 3  . SUMAtriptan (IMITREX) 100 MG tablet Take 1 tablet (100 mg total) by mouth once. May repeat in 2 hours if headache persists or recurs. 10 tablet 5  . traMADol (ULTRAM) 50 MG tablet Take 1-2 tablets (50-100 mg total) by mouth every 6 (six) hours as needed. Max 8 tabs/day. Do not combine with ambien. 240 tablet 2  . carisoprodol (SOMA) 350 MG tablet Take 1 tablet (350 mg total) by mouth at bedtime. (Patient not taking: Reported on 11/18/2014) 30 tablet 0  . diltiazem (CARDIZEM) 30 MG tablet Take 1 tablet (30 mg total) by mouth 4 (four)  times daily as needed (elevated blood pressure >140/90). (Patient not taking: Reported on 11/18/2014) 120 tablet 0  . progesterone (PROMETRIUM) 200 MG capsule One qhs beginning Day 3 of menses. 18 capsule 0   No current facility-administered medications on file prior to visit.   Allergies  Allergen Reactions  . Penicillins Hives and Itching  . Neurontin [Gabapentin]     headache    Review of Systems  Constitutional: Positive for fatigue. Negative for fever, chills, diaphoresis, activity change, appetite change and unexpected weight change.  Eyes: Negative for visual disturbance.  Respiratory: Negative for wheezing.     Cardiovascular: Negative for palpitations and leg swelling.  Gastrointestinal: Positive for abdominal pain. Negative for nausea and vomiting.  Genitourinary: Positive for vaginal bleeding and menstrual problem. Negative for dysuria, urgency, frequency, enuresis and difficulty urinating.  Musculoskeletal: Positive for myalgias, back pain, arthralgias and neck stiffness. Negative for joint swelling, gait problem and neck pain.  Skin: Negative for color change.  Neurological: Positive for headaches. Negative for dizziness, tremors, syncope, weakness, light-headedness and numbness.  Hematological: Negative for adenopathy. Does not bruise/bleed easily.  Psychiatric/Behavioral: Positive for sleep disturbance. Negative for behavioral problems, confusion, self-injury and dysphoric mood. The patient is nervous/anxious. The patient is not hyperactive.        Objective:  BP 142/96 mmHg  Pulse 84  Temp(Src) 98.6 F (37 C) (Oral)  Resp 16  Ht 5' 1.5" (1.562 m)  Wt 142 lb 9.6 oz (64.683 kg)  BMI 26.51 kg/m2  LMP 11/11/2014  Physical Exam  Constitutional: She is oriented to person, place, and time. She appears well-developed and well-nourished. No distress.  HENT:  Head: Normocephalic and atraumatic.  Eyes: Conjunctivae and EOM are normal.  Neck: Neck supple. No tracheal deviation present.  Cardiovascular: Normal rate.   Pulmonary/Chest: Effort normal. No respiratory distress.  Musculoskeletal: Normal range of motion.  Neurological: She is alert and oriented to person, place, and time.  Skin: Skin is warm and dry.  Psychiatric: She has a normal mood and affect. Her behavior is normal.  Nursing note and vitals reviewed.      Assessment & Plan:    Meds ordered this encounter  Medications  . traMADol (ULTRAM) 50 MG tablet    Sig: Take 1-2 tablets (50-100 mg total) by mouth every 6 (six) hours as needed. Max 8 tabs/day.    Dispense:  240 tablet    Refill:  5  . pantoprazole (PROTONIX) 40  MG tablet    Sig: Take 1 tablet (40 mg total) by mouth daily.    Dispense:  90 tablet    Refill:  3  . nabumetone (RELAFEN) 750 MG tablet    Sig: Take 1 tablet (750 mg total) by mouth 2 (two) times daily as needed for mild pain.    Dispense:  180 tablet    Refill:  1  . methocarbamol (ROBAXIN) 500 MG tablet    Sig: Take 1 tablet (500 mg total) by mouth 4 (four) times daily.    Dispense:  120 tablet    Refill:  3  . clonazePAM (KLONOPIN) 2 MG tablet    Sig: Take 0.5-1 tablets (1-2 mg total) by mouth at bedtime as needed for anxiety.    Dispense:  90 tablet    Refill:  3  . butalbital-acetaminophen-caffeine (FIORICET, ESGIC) 50-325-40 MG per tablet    Sig: TAKE 1 TO 2 TABLETS BY MOUTH EVERY 6 HOURS AS NEEDED FOR HEADACHE.    Dispense:  120 tablet  Refill:  3    I personally performed the services described in this documentation, which was scribed in my presence. The recorded information has been reviewed and considered, and addended by me as needed.  Jenna Cheadle, MD MPH

## 2014-11-18 NOTE — Patient Instructions (Addendum)
I am SO sorry about this - I was unaware that they would be so strict.   You told us that you would need FMLA papers completed which I would be happy to do but then closed the encounter - assuming that after I received the papers from [employer] you would be contacted. However, it looks like they never sent [place] the FMLA forms so there was never another note/encounter triggered to remind me to do them - it was out of site, out of mind.   Do know going forward that if you ever need me to write a short note stating that your recent job loss was due to the lack of timely completion of paperwork on my behalf, I would be happy to do so.  In the meantime as well, don't hesitate to call or e-mail if you need anything and we will try to do as much as we can over-the-phone.  Of course if you have any urine issues or other things that need to be seen in the office don't hesitate to come in and we will try to make the visit as inexpensive as possible.

## 2014-11-21 ENCOUNTER — Ambulatory Visit: Payer: Self-pay | Admitting: Gynecology

## 2014-11-21 NOTE — Telephone Encounter (Signed)
error 

## 2014-11-22 ENCOUNTER — Ambulatory Visit: Payer: Self-pay | Admitting: Gynecology

## 2014-11-22 ENCOUNTER — Other Ambulatory Visit: Payer: Self-pay

## 2014-11-22 ENCOUNTER — Ambulatory Visit: Payer: Self-pay | Admitting: Family Medicine

## 2014-11-28 ENCOUNTER — Ambulatory Visit: Payer: Self-pay | Admitting: Gynecology

## 2014-11-29 ENCOUNTER — Encounter: Payer: Self-pay | Admitting: Family Medicine

## 2014-11-29 ENCOUNTER — Ambulatory Visit: Payer: Self-pay | Admitting: Gynecology

## 2014-11-29 ENCOUNTER — Other Ambulatory Visit: Payer: Self-pay

## 2014-12-06 MED ORDER — TRAMADOL HCL 50 MG PO TABS: 50.0000 mg | ORAL_TABLET | Freq: Four times a day (QID) | ORAL | Status: AC | PRN

## 2014-12-06 NOTE — Telephone Encounter (Signed)
Called pt. [facility] is already closed so called 90d refill of tramadol with 1 refill in to [facility] on [address].

## 2014-12-06 NOTE — Telephone Encounter (Signed)
INSURANCE RUNS OUT TODAY AT FIVE PM   WANTS HELP TO GET HER MEDS.  IN A BIND AND NEEDS HELP TODAY.   [phone]

## 2015-02-22 ENCOUNTER — Encounter: Payer: Self-pay | Admitting: Family Medicine

## 2015-02-26 ENCOUNTER — Encounter: Payer: Self-pay | Admitting: Family Medicine

## 2015-03-12 ENCOUNTER — Telehealth: Payer: 59 | Admitting: Family

## 2015-03-12 ENCOUNTER — Encounter: Payer: Self-pay | Admitting: Family Medicine

## 2015-03-12 DIAGNOSIS — M546 Pain in thoracic spine: Secondary | ICD-10-CM | POA: Diagnosis not present

## 2015-03-12 MED ORDER — CYCLOBENZAPRINE HCL 10 MG PO TABS: 10.0000 mg | ORAL_TABLET | Freq: Three times a day (TID) | ORAL | Status: AC | PRN

## 2015-03-12 MED ORDER — ETODOLAC 300 MG PO CAPS: 300.0000 mg | ORAL_CAPSULE | Freq: Two times a day (BID) | ORAL | Status: AC

## 2015-03-12 NOTE — Progress Notes (Signed)

## 2015-03-22 IMAGING — CT CT ABD-PELV W/ CM
2 of 5 series · 16 of 46 positions shown, 18 images · IV contrast (APPLIED)
Comparison: 09/30/2013

CLINICAL DATA: Generalized abdominal pain.

EXAM:
CT ABDOMEN AND PELVIS WITH CONTRAST
TECHNIQUE: Multidetector CT imaging of the abdomen and pelvis was performed
using the standard protocol following bolus administration of
intravenous contrast.
CONTRAST:  50mL OMNIPAQUE IOHEXOL 300 MG/ML SOLN, 100mL OMNIPAQUE
IOHEXOL 300 MG/ML SOLN

[Series 2: abd/pelvis 5.0 b31f · axial · 0.65mm/px · z∈[-756,-336]mm · 13 of 94 slices shown, 15 images]
[im 5/94  soft-tissue]
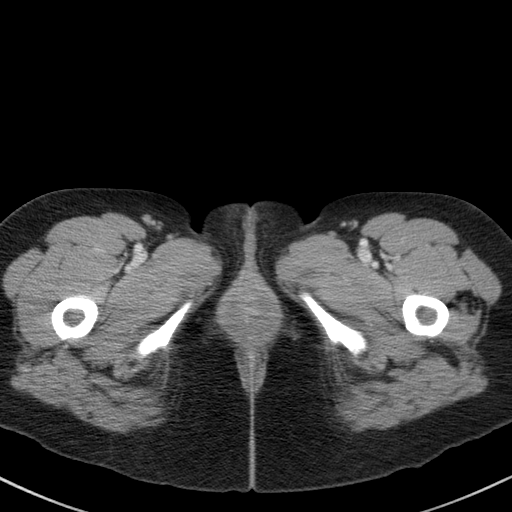
[im 5/94  bone]
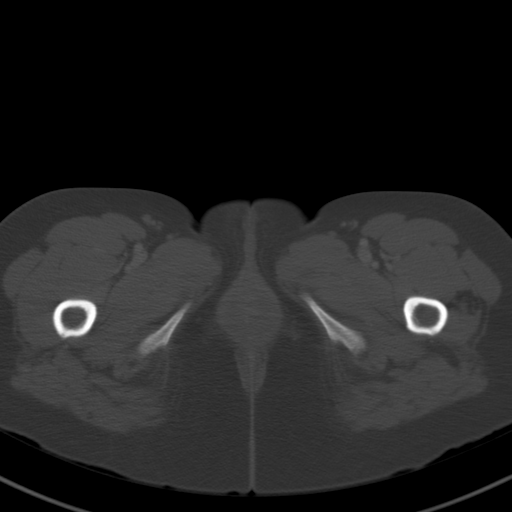
[im 15/94  soft-tissue]
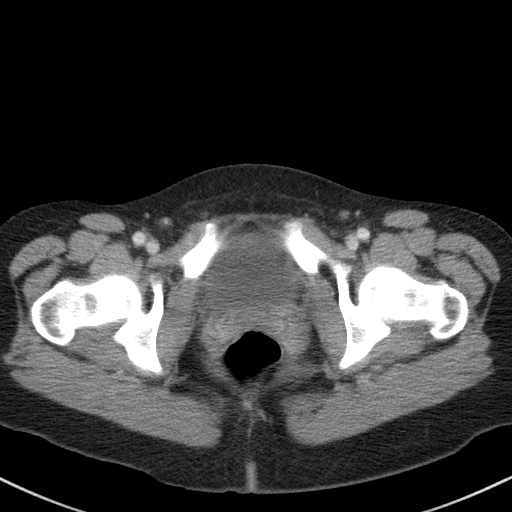
[im 20/94  soft-tissue]
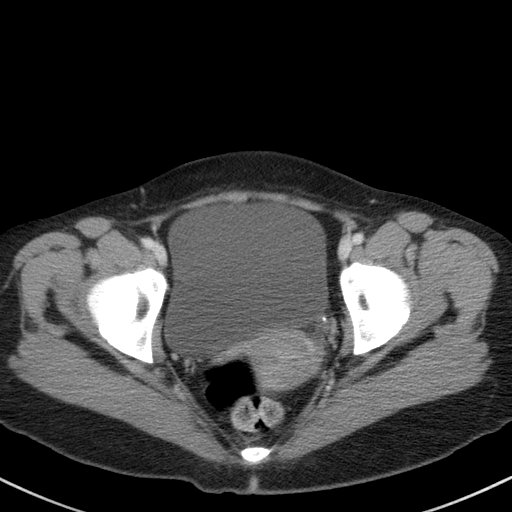
[im 25/94  soft-tissue]
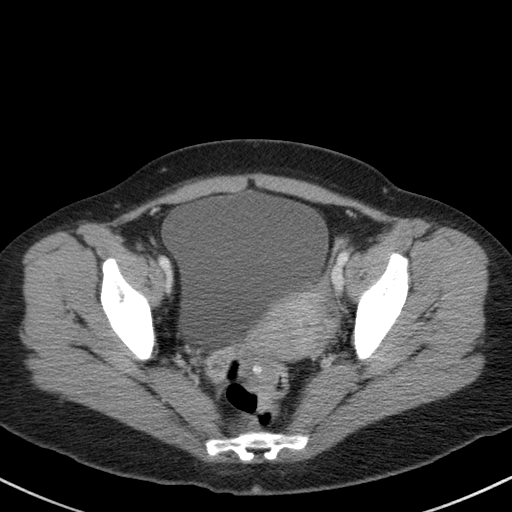
[im 35/94  soft-tissue]
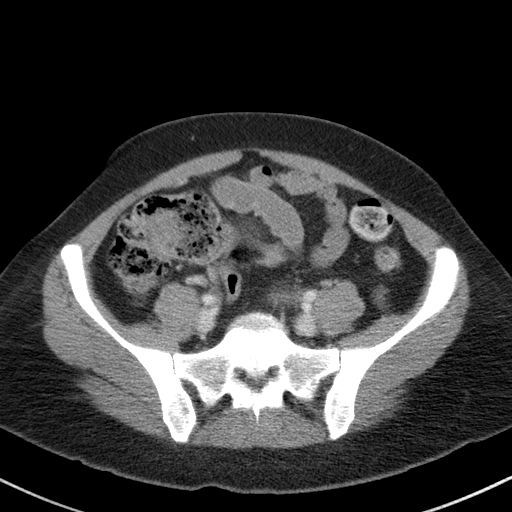
[im 40/94  soft-tissue]
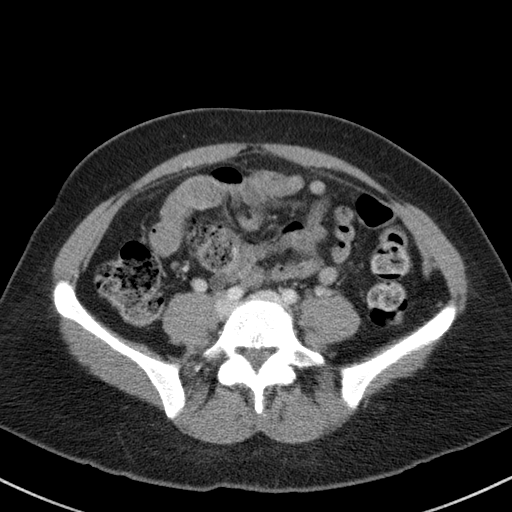
[im 49/94  soft-tissue]
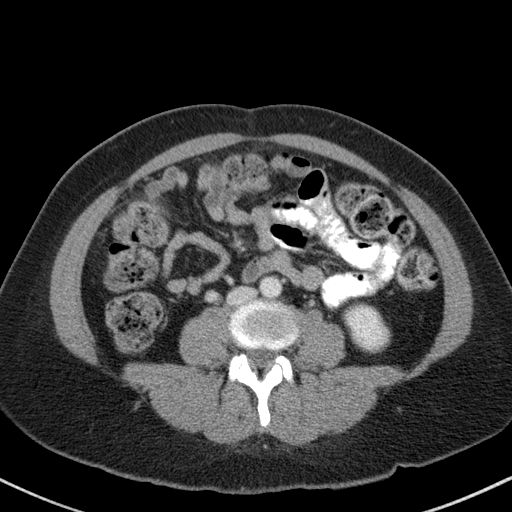
[im 54/94  soft-tissue]
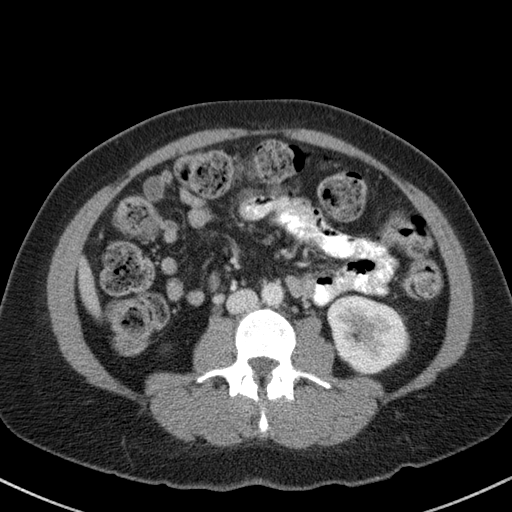
[im 59/94  soft-tissue]
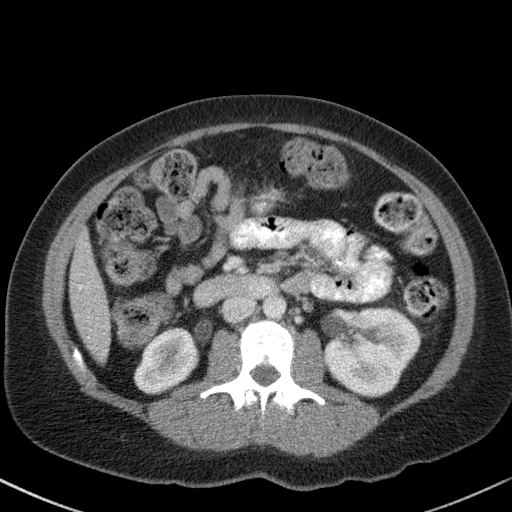
[im 59/94  bone]
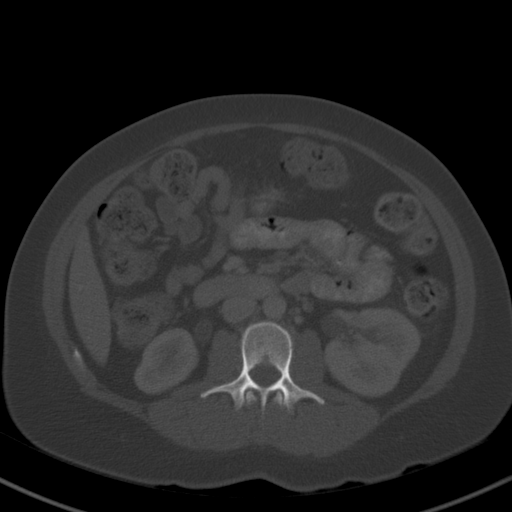
[im 69/94  soft-tissue]
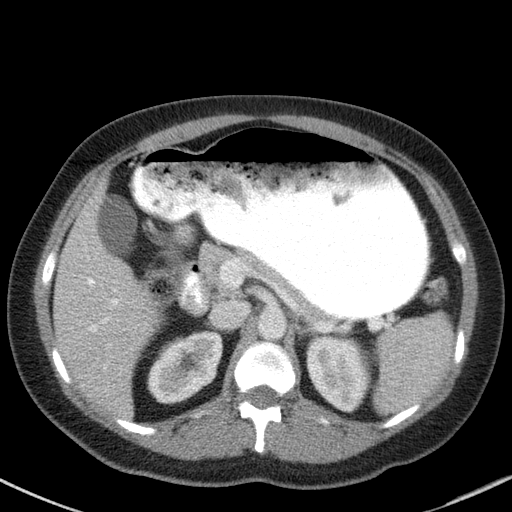
[im 74/94  soft-tissue]
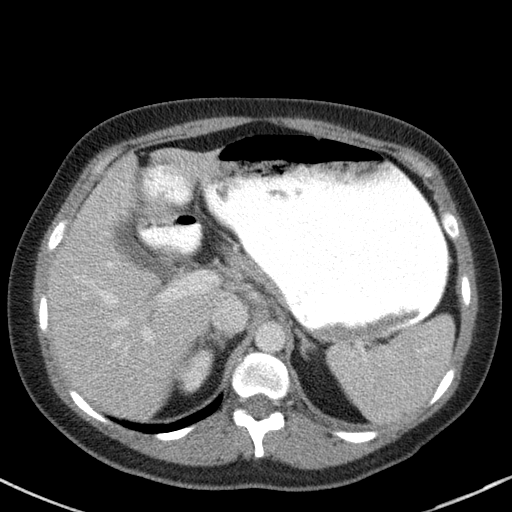
[im 79/94  soft-tissue]
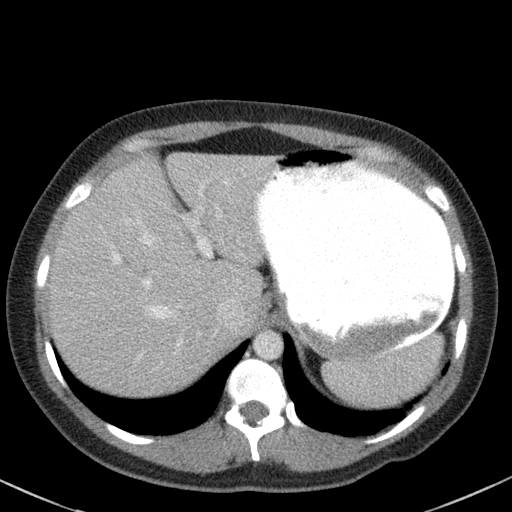
[im 89/94  soft-tissue]
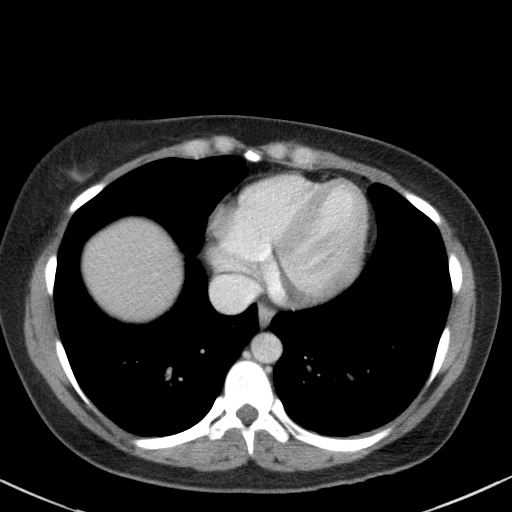

[Series 5: abd/pelvis 3.0 coronal · coronal · 0.68mm/px · 3 of 91 slices shown]
[im 31/91  soft-tissue]
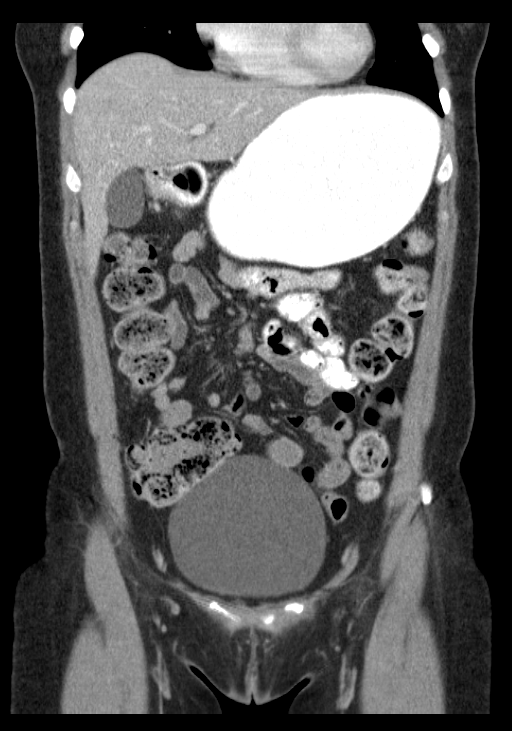
[im 41/91  soft-tissue]
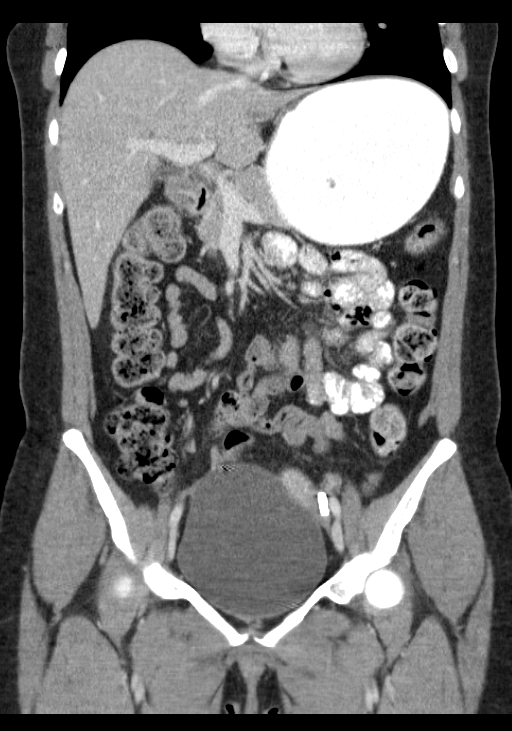
[im 51/91  soft-tissue]
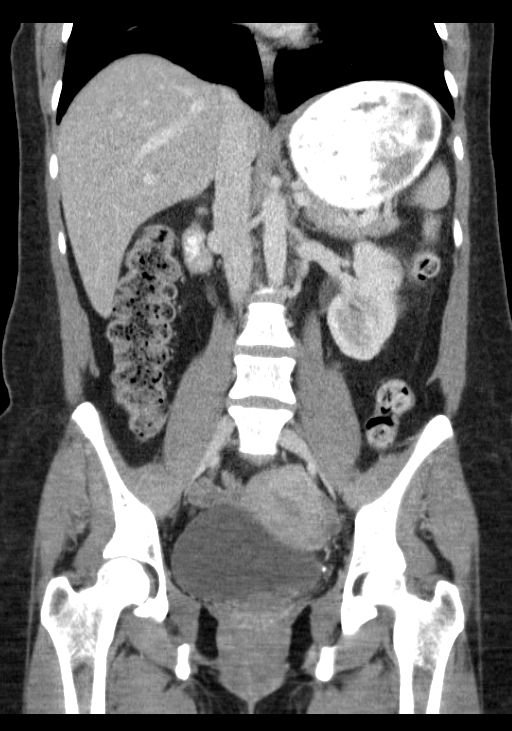

[16 of 46 positions shown; findings below may reference images not displayed]

FINDINGS: BODY WALL: Unremarkable.

LOWER CHEST: Unremarkable.

ABDOMEN/PELVIS:

Liver: No focal abnormality.

Biliary: No evidence of biliary obstruction or stone.

Pancreas: Unremarkable.

Spleen: Unremarkable.

Adrenals: Unremarkable.

Kidneys and ureters: Fullness of the bilateral urinary collecting
system which is likely related to distended urinary bladder. No
ureteral calculus or other obstructive process identified. There is
subtle hypoenhancement involving the interpolar anterior left renal
cortex, with mild surrounding perinephric fat infiltration. Fat
infiltration is new compared to noncontrast study 09/30/2013. No
abscess.

Right renal cortical thinning.

Bladder: Distended.  No wall thickening.

Reproductive: Bilateral tubal ligation.

Bowel: No obstruction. No evidence of bowel inflammation.
Appendectomy.

Retroperitoneum: No mass or adenopathy.

Peritoneum: No ascites or pneumoperitoneum.

Vascular: No acute abnormality.

OSSEOUS: No acute abnormalities.
IMPRESSION: 1. Suspect mild left pyelonephritis.  No hydronephrosis.
2. Distended urinary bladder.

## 2015-04-12 DEATH — deceased

## 2016-11-24 ENCOUNTER — Encounter: Payer: Self-pay | Admitting: Gynecology
